# Patient Record
Sex: Female | Born: 1950 | Race: White | Hispanic: No | Marital: Married | Smoking: Never smoker
Health system: Southern US, Community
[De-identification: ages and names within clinical notes are randomized; demographics above are authoritative.]

## PROBLEM LIST (undated history)

## (undated) DIAGNOSIS — I10 Essential (primary) hypertension: Secondary | ICD-10-CM

## (undated) DIAGNOSIS — E119 Type 2 diabetes mellitus without complications: Secondary | ICD-10-CM

## (undated) HISTORY — PX: CHOLECYSTECTOMY: SHX55

## (undated) HISTORY — PX: ABDOMINAL HYSTERECTOMY: SHX81

---

## 2018-09-01 ENCOUNTER — Observation Stay (HOSPITAL_BASED_OUTPATIENT_CLINIC_OR_DEPARTMENT_OTHER)
Admission: EM | Admit: 2018-09-01 | Discharge: 2018-09-03 | Disposition: A | Discharge status: Home / Self Care | Payer: Medicare Other | Attending: Internal Medicine | Admitting: Internal Medicine

## 2018-09-01 ENCOUNTER — Emergency Department (HOSPITAL_BASED_OUTPATIENT_CLINIC_OR_DEPARTMENT_OTHER): Payer: Medicare Other

## 2018-09-01 ENCOUNTER — Encounter (HOSPITAL_BASED_OUTPATIENT_CLINIC_OR_DEPARTMENT_OTHER): Payer: Self-pay

## 2018-09-01 ENCOUNTER — Other Ambulatory Visit: Payer: Self-pay

## 2018-09-01 DIAGNOSIS — G459 Transient cerebral ischemic attack, unspecified: Principal | ICD-10-CM | POA: Insufficient documentation

## 2018-09-01 DIAGNOSIS — R200 Anesthesia of skin: Secondary | ICD-10-CM | POA: Diagnosis present

## 2018-09-01 DIAGNOSIS — I10 Essential (primary) hypertension: Secondary | ICD-10-CM | POA: Diagnosis not present

## 2018-09-01 DIAGNOSIS — E119 Type 2 diabetes mellitus without complications: Secondary | ICD-10-CM

## 2018-09-01 HISTORY — DX: Essential (primary) hypertension: I10

## 2018-09-01 HISTORY — DX: Type 2 diabetes mellitus without complications: E11.9

## 2018-09-01 LAB — COMPREHENSIVE METABOLIC PANEL
ALBUMIN: 3.9 g/dL (ref 3.5–5.0)
ALK PHOS: 59 U/L (ref 38–126)
ALT: 31 U/L (ref 0–44)
AST: 26 U/L (ref 15–41)
Anion gap: 10 (ref 5–15)
BUN: 12 mg/dL (ref 8–23)
CALCIUM: 9.3 mg/dL (ref 8.9–10.3)
CHLORIDE: 99 mmol/L (ref 98–111)
CO2: 27 mmol/L (ref 22–32)
Creatinine, Ser: 0.84 mg/dL (ref 0.44–1.00)
GFR calc non Af Amer: >60 mL/min (ref >60)
GLUCOSE: 241 mg/dL — AB (ref 70–99)
Potassium: 2.7 mmol/L — CL (ref 3.5–5.1)
SODIUM: 136 mmol/L (ref 135–145)
Total Bilirubin: 0.6 mg/dL (ref 0.3–1.2)
Total Protein: 7.3 g/dL (ref 6.5–8.1)

## 2018-09-01 LAB — DIFFERENTIAL
ABS IMMATURE GRANULOCYTES: 0.01 10*3/uL (ref 0.00–0.07)
BASOS ABS: 0 10*3/uL (ref 0.0–0.1)
BASOS PCT: 0 %
Eosinophils Absolute: 0.1 10*3/uL (ref 0.0–0.5)
Eosinophils Relative: 2 %
IMMATURE GRANULOCYTES: 0 %
LYMPHS PCT: 37 %
Lymphs Abs: 1.7 10*3/uL (ref 0.7–4.0)
MONO ABS: 0.3 10*3/uL (ref 0.1–1.0)
Monocytes Relative: 7 %
NEUTROS ABS: 2.5 10*3/uL (ref 1.7–7.7)
NEUTROS PCT: 54 %

## 2018-09-01 LAB — PROTIME-INR
INR: 0.93
Prothrombin Time: 12.4 seconds (ref 11.4–15.2)

## 2018-09-01 LAB — CBC
HCT: 40.1 % (ref 36.0–46.0)
HEMOGLOBIN: 13.1 g/dL (ref 12.0–15.0)
MCH: 29.3 pg (ref 26.0–34.0)
MCHC: 32.7 g/dL (ref 30.0–36.0)
MCV: 89.7 fL (ref 80.0–100.0)
NRBC: 0 % (ref 0.0–0.2)
Platelets: 211 10*3/uL (ref 150–400)
RBC: 4.47 MIL/uL (ref 3.87–5.11)
RDW: 12.7 % (ref 11.5–15.5)
WBC: 4.6 10*3/uL (ref 4.0–10.5)

## 2018-09-01 LAB — APTT: APTT: 26 s (ref 24–36)

## 2018-09-01 LAB — MAGNESIUM: MAGNESIUM: 1.7 mg/dL (ref 1.7–2.4)

## 2018-09-01 LAB — TROPONIN I: Troponin I: <0.03 ng/mL (ref <0.03)

## 2018-09-01 LAB — CBG MONITORING, ED: GLUCOSE-CAPILLARY: 226 mg/dL — AB (ref 70–99)

## 2018-09-01 MED ORDER — POTASSIUM CHLORIDE 10 MEQ/100ML IV SOLN
10.0000 meq | INTRAVENOUS | Status: AC
  Administered 2018-09-01: 10 meq via INTRAVENOUS
  Filled 2018-09-01: qty 100

## 2018-09-01 MED ORDER — ASPIRIN 325 MG PO TABS
650.0000 mg | ORAL_TABLET | Freq: Once | ORAL | Status: AC
  Administered 2018-09-01: 650 mg via ORAL
  Filled 2018-09-01: qty 2

## 2018-09-01 MED ORDER — MAGNESIUM SULFATE 2 GM/50ML IV SOLN
2.0000 g | Freq: Once | INTRAVENOUS | Status: AC
  Administered 2018-09-01: 2 g via INTRAVENOUS
  Filled 2018-09-01: qty 50

## 2018-09-01 MED ORDER — POTASSIUM CHLORIDE CRYS ER 20 MEQ PO TBCR
40.0000 meq | EXTENDED_RELEASE_TABLET | Freq: Once | ORAL | Status: AC
  Administered 2018-09-01: 40 meq via ORAL
  Filled 2018-09-01: qty 2

## 2018-09-01 NOTE — ED Notes (Signed)
Patient transported to CT 

## 2018-09-01 NOTE — ED Provider Notes (Signed)
[facility] EMERGENCY DEPARTMENT Provider Note   CSN: [id number] Arrival date & time: 09/01/18  1942     History   Chief Complaint Chief Complaint  Patient presents with  . Numbness    HPI Samantha Wise is a 67 y.o. female.  67 year old female with past medical history including hypertension, type 2 diabetes mellitus who presents with numbness.  At 7 PM this evening, she was in her kitchen when she had a sudden onset of numbness involving her left face and left arm, associated with left facial droop that husband also noticed as well as not being able to get words out.  All of these symptoms have since resolved.  She denies any extremity weakness, headache, or vision changes.  No fevers or recent illness.  She denies any personal history of TIA or stroke but she does have a family history of stroke in both parents.  The history is provided by the patient and the spouse.    Past Medical History:  Diagnosis Date  . Diabetes mellitus without complication (HCC)   . Hypertension     Patient Active Problem List   Diagnosis Date Noted  . TIA (transient ischemic attack) 09/01/2018    Past Surgical History:  Procedure Laterality Date  . ABDOMINAL HYSTERECTOMY    . CHOLECYSTECTOMY       OB History   None      Home Medications    Prior to Admission medications   Not on File    Family History No family history on file.  Social History Social History   Tobacco Use  . Smoking status: Never Smoker  . Smokeless tobacco: Never Used  Substance Use Topics  . Alcohol use: Yes    Comment: occ  . Drug use: Never     Allergies   Other and Shellfish allergy   Review of Systems Review of Systems All other systems reviewed and are negative except that which was mentioned in HPI   Physical Exam Updated Vital Signs BP (!) 169/92   Pulse 78   Temp 98.8 F (37.1 C) (Oral)   Resp 13   Ht 5\' 3"  (1.6 m)   Wt 68.9 kg   SpO2 98%   BMI 26.93  kg/m   Physical Exam  Constitutional: She is oriented to person, place, and time. She appears well-developed and well-nourished. No distress.  Awake, alert  HENT:  Head: Normocephalic and atraumatic.  Eyes: Pupils are equal, round, and reactive to light. Conjunctivae and EOM are normal.  Neck: Neck supple.  Cardiovascular: Normal rate, regular rhythm and normal heart sounds.  No murmur heard. Pulmonary/Chest: Effort normal and breath sounds normal. No respiratory distress.  Abdominal: Soft. Bowel sounds are normal. She exhibits no distension. There is no tenderness.  Musculoskeletal: She exhibits no edema.  Neurological: She is alert and oriented to person, place, and time. She has normal reflexes. She displays normal reflexes. No sensory deficit. She exhibits normal muscle tone. Coordination normal.  Very subtle weakness L corner of mouth compared to right, remainder of CN intact; Fluent speech, normal finger-to-nose testing, negative pronator drift, no clonus 5/5 strength and normal sensation x all 4 extremities  Skin: Skin is warm and dry.  Psychiatric: She has a normal mood and affect. Judgment and thought content normal.  Nursing note and vitals reviewed.    ED Treatments / Results  Labs (all labs ordered are listed, but only abnormal results are displayed) Labs Reviewed  COMPREHENSIVE METABOLIC PANEL - Abnormal;  Notable for the following components:      Result Value   Potassium 2.7 (*)    Glucose, Bld 241 (*)    All other components within normal limits  CBG MONITORING, ED - Abnormal; Notable for the following components:   Glucose-Capillary 226 (*)    All other components within normal limits  PROTIME-INR  APTT  CBC  DIFFERENTIAL  TROPONIN I  MAGNESIUM  CBG MONITORING, ED    EKG None  Radiology Ct Head Code Stroke Wo Contrast  Result Date: 09/01/2018 CLINICAL DATA:  Code stroke. Acute onset left arm and facial numbness with speech disturbance. EXAM: CT HEAD  WITHOUT CONTRAST TECHNIQUE: Contiguous axial images were obtained from the base of the skull through the vertex without intravenous contrast. COMPARISON:  None. FINDINGS: Brain: There is no evidence of acute infarct, intracranial hemorrhage, mass, midline shift, or extra-axial fluid collection. The ventricles and sulci are within normal limits for age. Minimal cerebral white matter hypoattenuation is nonspecific but likely reflects chronic small vessel ischemia. There is a small chronic infarct in the posterior right cerebellum. Vascular: Calcified atherosclerosis at the skull base. No hyperdense vessel. Skull: No fracture or focal osseous lesion. Sinuses/Orbits: Chronic right maxillary sinusitis with moderate mucosal thickening. Clear mastoid air cells. Unremarkable orbits. Other: None. ASPECTS [facility] Stroke Program Early CT Score) - Ganglionic level infarction (caudate, lentiform nuclei, internal capsule, insula, M1-M3 cortex): 7 - Supraganglionic infarction (M4-M6 cortex): 3 Total score (0-10 with 10 being normal): 10 IMPRESSION: 1. No evidence of acute intracranial abnormality. 2. ASPECTS is 10. 3. Small chronic right cerebellar infarct. These results were called by telephone at the time of interpretation on 09/01/2018 at 8:30 pm to Dr. Frederick Peers , who verbally acknowledged these results. Electronically Signed   By: Sebastian Ache M.D.   On: 09/01/2018 20:32    Procedures Procedures (including critical care time)  Medications Ordered in ED Medications  magnesium sulfate IVPB 2 g 50 mL (has no administration in time range)  potassium chloride 10 mEq in 100 mL IVPB (has no administration in time range)  potassium chloride SA (K-DUR,KLOR-CON) CR tablet 40 mEq (has no administration in time range)  aspirin tablet 650 mg (650 mg Oral Given 09/01/18 2041)     Initial Impression / Assessment and Plan / ED Course  I have reviewed the triage vital signs and the nursing notes.  Pertinent labs &  imaging results that were available during my care of the patient were reviewed by me and considered in my medical decision making (see chart for details).    Pt had fluent speech on exam, very subtle left corner of mouth weakness compared to right. No other neuro deficits. Discussed w/ Dr. Otelia Limes, who agreed that since most of deficits resolved and very subtle mouth droop is only remaining symptom, the risks of tPA would outweigh the benefits. Therefore did not call code stroke. Discussed this with the patient who is in agreement with plan to forego tPA at this point unless symptoms suddenly change.   Head CT negative for acute process, evidence of old right cerebellar infarct.  Lab work notable for potassium of 2.7, glucose 241.  Gave oral and IV potassium repletion as well as magnesium repletion.  Per Dr. Shelbie Hutching request, ordered 650 mg aspirin.  Discussed admission at [facility] with [facility], Dr. Sherrlyn Hock, who has accepted the patient in transfer.  Patient will be admitted to [facility] for further stroke work-up. Final Clinical Impressions(s) / ED Diagnoses  Final diagnoses:  TIA (transient ischemic attack)    ED Discharge Orders    None       Everlina Gotts, Ambrose Finland, MD 09/01/18 2132

## 2018-09-01 NOTE — ED Notes (Signed)
Attempted IV stick without success 

## 2018-09-01 NOTE — ED Notes (Signed)
Called Neurology (Dr. Otelia Limes) for consult.

## 2018-09-01 NOTE — ED Notes (Signed)
CRITICAL VALUE ALERT   Critical Value:  Potassium 2.7 Date & Time Notied:09/01/2018 @ 2119  Provider Notified:  Dr. Clarene Duke

## 2018-09-01 NOTE — ED Triage Notes (Signed)
Pt reports at 7p she was standing in her kitchen when she had onset of numbness to left arm and left side of face and "felt like I couldn't get my words out and the left side of my face was drawing"-pt to triage in w/c-NAD

## 2018-09-02 ENCOUNTER — Observation Stay (HOSPITAL_COMMUNITY): Payer: Medicare Other

## 2018-09-02 ENCOUNTER — Encounter (HOSPITAL_COMMUNITY): Payer: Self-pay | Admitting: Internal Medicine

## 2018-09-02 DIAGNOSIS — I10 Essential (primary) hypertension: Secondary | ICD-10-CM

## 2018-09-02 DIAGNOSIS — G459 Transient cerebral ischemic attack, unspecified: Secondary | ICD-10-CM

## 2018-09-02 DIAGNOSIS — R200 Anesthesia of skin: Secondary | ICD-10-CM | POA: Diagnosis present

## 2018-09-02 DIAGNOSIS — E119 Type 2 diabetes mellitus without complications: Secondary | ICD-10-CM

## 2018-09-02 LAB — GLUCOSE, CAPILLARY: Glucose-Capillary: 108 mg/dL — ABNORMAL HIGH (ref 70–99)

## 2018-09-02 MED ORDER — POTASSIUM CHLORIDE CRYS ER 20 MEQ PO TBCR
20.0000 meq | EXTENDED_RELEASE_TABLET | Freq: Every day | ORAL | Status: DC
  Administered 2018-09-02 – 2018-09-03 (×2): 20 meq via ORAL
  Filled 2018-09-02 (×2): qty 1

## 2018-09-02 MED ORDER — SENNOSIDES-DOCUSATE SODIUM 8.6-50 MG PO TABS: 1.0000 | ORAL_TABLET | Freq: Every evening | ORAL | Status: DC | PRN

## 2018-09-02 MED ORDER — ACETAMINOPHEN 650 MG RE SUPP: 650.0000 mg | RECTAL | Status: DC | PRN

## 2018-09-02 MED ORDER — SIMVASTATIN 40 MG PO TABS
40.0000 mg | ORAL_TABLET | Freq: Every day | ORAL | Status: DC
  Administered 2018-09-02 – 2018-09-03 (×2): 40 mg via ORAL
  Filled 2018-09-02 (×2): qty 1

## 2018-09-02 MED ORDER — LORATADINE 10 MG PO TABS
10.0000 mg | ORAL_TABLET | Freq: Every day | ORAL | Status: DC
  Administered 2018-09-02 – 2018-09-03 (×2): 10 mg via ORAL
  Filled 2018-09-02 (×2): qty 1

## 2018-09-02 MED ORDER — HYDROCHLOROTHIAZIDE 25 MG PO TABS
25.0000 mg | ORAL_TABLET | Freq: Every day | ORAL | Status: DC
  Administered 2018-09-02 – 2018-09-03 (×2): 25 mg via ORAL
  Filled 2018-09-02 (×2): qty 1

## 2018-09-02 MED ORDER — ASPIRIN 300 MG RE SUPP: 300.0000 mg | Freq: Every day | RECTAL | Status: DC

## 2018-09-02 MED ORDER — ASPIRIN 325 MG PO TABS
325.0000 mg | ORAL_TABLET | Freq: Every day | ORAL | Status: DC
  Administered 2018-09-02 – 2018-09-03 (×2): 325 mg via ORAL
  Filled 2018-09-02 (×2): qty 1

## 2018-09-02 MED ORDER — VALSARTAN-HYDROCHLOROTHIAZIDE 160-25 MG PO TABS: 1.0000 | ORAL_TABLET | Freq: Every day | ORAL | Status: DC

## 2018-09-02 MED ORDER — IRBESARTAN 150 MG PO TABS
150.0000 mg | ORAL_TABLET | Freq: Every day | ORAL | Status: DC
  Filled 2018-09-02: qty 1

## 2018-09-02 MED ORDER — METFORMIN HCL 500 MG PO TABS
1000.0000 mg | ORAL_TABLET | Freq: Two times a day (BID) | ORAL | Status: DC
  Administered 2018-09-02 – 2018-09-03 (×2): 1000 mg via ORAL
  Filled 2018-09-02 (×2): qty 2

## 2018-09-02 MED ORDER — ACETAMINOPHEN 325 MG PO TABS: 650.0000 mg | ORAL_TABLET | ORAL | Status: DC | PRN

## 2018-09-02 MED ORDER — ACETAMINOPHEN 160 MG/5ML PO SOLN: 650.0000 mg | ORAL | Status: DC | PRN

## 2018-09-02 MED ORDER — GLIPIZIDE 5 MG PO TABS
5.0000 mg | ORAL_TABLET | Freq: Every day | ORAL | Status: DC
  Administered 2018-09-02 – 2018-09-03 (×2): 5 mg via ORAL
  Filled 2018-09-02 (×2): qty 1

## 2018-09-02 MED ORDER — ENOXAPARIN SODIUM 40 MG/0.4ML ~~LOC~~ SOLN
40.0000 mg | SUBCUTANEOUS | Status: DC
  Administered 2018-09-02: 40 mg via SUBCUTANEOUS
  Filled 2018-09-02: qty 0.4

## 2018-09-02 MED ORDER — NON FORMULARY
Freq: Once | Status: DC
  Administered 2018-09-02: 160 mg via ORAL

## 2018-09-02 MED ORDER — STROKE: EARLY STAGES OF RECOVERY BOOK
Freq: Once | Status: AC
  Administered 2018-09-02: 22:00:00
  Filled 2018-09-02: qty 1

## 2018-09-02 MED ORDER — LINAGLIPTIN 5 MG PO TABS
5.0000 mg | ORAL_TABLET | Freq: Every day | ORAL | Status: DC
  Filled 2018-09-02 (×2): qty 1

## 2018-09-02 MED ORDER — VALSARTAN 160 MG PO TABS
160.0000 mg | ORAL_TABLET | Freq: Once | ORAL | Status: DC
  Filled 2018-09-02: qty 1

## 2018-09-02 NOTE — Consult Note (Addendum)
Referring Physician: Dr. Alcario Drought    Chief Complaint:  TIA  HPI: Samantha Wise is an 67 y.o. female presenting to [facility] in transfer from [facility] for evaluation of a TIA. She has a PMHx of DM and HTN. Her symptoms began in the evening yesterday at 7 PM with sudden onset of left face and arm numbness with left facial droop and speech difficulty. By the time of her ED evaluation, her symptoms had resolved. CT head in the ED was unremarkable. She was started on ASA. 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   Past Medical History:  Diagnosis Date  . Diabetes mellitus without complication ([place])   . Hypertension     Past Surgical History:  Procedure Laterality Date  . ABDOMINAL HYSTERECTOMY    . CHOLECYSTECTOMY      Family History  Problem Relation Age of Onset  . Stroke Mother 27   Social History:  reports that she has never smoked. She has never used smokeless tobacco. She reports that she drinks alcohol. She reports that she does not use drugs.  Allergies:  Allergies  Allergen Reactions  . Other     "an abx"  . Shellfish Allergy     Medications:  Prior to Admission:  Medications Prior to Admission  Medication Sig Dispense Refill Last Dose  . cetirizine (ZYRTEC) 10 MG tablet Take 10 mg by mouth daily.     Marland Kitchen estradiol (CLIMARA) 0.06 MG/24HR Place 1 patch onto the skin once a week.     Marland Kitchen glipiZIDE (GLUCOTROL) 5 MG tablet Take 5 mg by mouth daily before breakfast.     . metFORMIN (GLUCOPHAGE) 1000 MG tablet Take 1,000 mg by mouth 2 (two) times daily with a meal.     . potassium chloride SA (K-DUR,KLOR-CON) 20 MEQ tablet Take 20 mEq by mouth daily.     . simvastatin (ZOCOR) 40 MG tablet Take 40 mg by mouth daily.     . sitaGLIPtin (JANUVIA) 100 MG tablet Take 100 mg by mouth daily.     . valsartan-hydrochlorothiazide (DIOVAN-HCT) 160-25 MG tablet Take 1 tablet by mouth daily.      Scheduled: .  stroke: mapping our early stages  of recovery book   Does not apply Once  . aspirin  300 mg Rectal Daily   Or  . aspirin  325 mg Oral Daily  . enoxaparin (LOVENOX) injection  40 mg Subcutaneous Q24H  . [START ON 09/03/2018] glipiZIDE  5 mg Oral QAC breakfast  . hydrochlorothiazide  25 mg Oral Daily  . linagliptin  5 mg Oral Daily  . loratadine  10 mg Oral Daily  . metFORMIN  1,000 mg Oral BID WC  . potassium chloride SA  20 mEq Oral Daily  . simvastatin  40 mg Oral Daily  . valsartan  160 mg Oral Once    ROS: Does not endorse headache, current visual symptoms or somatic complaints. All neurological symptoms currently resolved.   Physical Examination: Blood pressure (!) 166/90, pulse 77, temperature 98.3 F (36.8 C), temperature source Oral, resp. rate 18, height 5' 3.5" (1.613 m), weight 65.7 kg, SpO2 97 %.  HEENT: [place]/AT Lungs: Respirations unlabored.  Ext: No edema.   Neurologic Examination: Mental Status:  Alert, fully oriented, thought content appropriate.  Speech fluent with intact comprehension for basic motor commands; however, she incorrectly performed one part of a 4-step directional command. Repetition also impaired. Naming intact. Cranial Nerves: II:  Visual  fields intact with no extinction to DSS. PERRL.   III,IV, VI: No ptosis not present. EOMI without nystagmus.  V,VII: No facial droop, facial temp sensation subjectively normal bilaterally VIII: hearing intact to voice IX,X: No hypophonia or hoarseness XI: Symmetric XII: midline tongue extension  Motor: Right : Upper extremity   5/5    Left:     Upper extremity   5/5  Lower extremity   5/5     Lower extremity   5/5 Normal tone throughout; no atrophy noted Sensory: Decreased temp sensation left forearm, sparing the hand. Sensory exam otherwise normal. No extinction to DSS.   Deep Tendon Reflexes:  BUE: 2+ and symmetric Right patella and right achilles: trace Left patella and left achilles: 2+ Plantars: Right: downgoing   Left:  downgoing Cerebellar: No ataxia with FNF bilaterally Gait: Deferred  Results for orders placed or performed during the hospital encounter of 09/01/18 (from the past 48 hour(s))  CBG monitoring, ED     Status: Abnormal   Collection Time: 09/01/18  7:51 PM  Result Value Ref Range   Glucose-Capillary 226 (H) 70 - 99 mg/dL  Protime-INR     Status: None   Collection Time: 09/01/18  8:26 PM  Result Value Ref Range   Prothrombin Time 12.4 11.4 - 15.2 seconds   INR 0.93     Comment: Performed at [facility], [address] [place], [place] [address]  APTT     Status: None   Collection Time: 09/01/18  8:26 PM  Result Value Ref Range   aPTT 26 24 - 36 seconds    Comment: Performed at [facility], [address] [place], [place] [address]  CBC     Status: None   Collection Time: 09/01/18  8:26 PM  Result Value Ref Range   WBC 4.6 4.0 - 10.5 K/uL   RBC 4.47 3.87 - 5.11 MIL/uL   Hemoglobin 13.1 12.0 - 15.0 g/dL   HCT 40.1 36.0 - 46.0 %   MCV 89.7 80.0 - 100.0 fL   MCH 29.3 26.0 - 34.0 pg   MCHC 32.7 30.0 - 36.0 g/dL   RDW 12.7 11.5 - 15.5 %   Platelets 211 150 - 400 K/uL   nRBC 0.0 0.0 - 0.2 %    Comment: Performed at [facility], [address] [place], [place] [address]  Differential     Status: None   Collection Time: 09/01/18  8:26 PM  Result Value Ref Range   Neutrophils Relative % 54 %   Neutro Abs 2.5 1.7 - 7.7 K/uL   Lymphocytes Relative 37 %   Lymphs Abs 1.7 0.7 - 4.0 K/uL   Monocytes Relative 7 %   Monocytes Absolute 0.3 0.1 - 1.0 K/uL   Eosinophils Relative 2 %   Eosinophils Absolute 0.1 0.0 - 0.5 K/uL   Basophils Relative 0 %   Basophils Absolute 0.0 0.0 - 0.1 K/uL   Immature Granulocytes 0 %   Abs Immature Granulocytes 0.01 0.00 - 0.07 K/uL    Comment: Performed at [facility], [address] [place], [place] [address]  Comprehensive metabolic panel     Status: Abnormal   Collection Time: 09/01/18  8:26 PM  Result  Value Ref Range   Sodium 136 135 - 145 mmol/L   Potassium 2.7 (LL) 3.5 - 5.1 mmol/L    Comment: CRITICAL RESULT CALLED TO, READ BACK BY AND VERIFIED WITH: Garlon Hatchet  [facility] RN AT 2118 ON 09/01/2018 I.SUGUT    Chloride 99 98 - 111 mmol/L   CO2 27 22 - 32 mmol/L   Glucose, Bld 241 (H) 70 - 99 mg/dL   BUN 12 8 - 23 mg/dL   Creatinine, Ser 0.84 0.44 - 1.00 mg/dL   Calcium 9.3 8.9 - 10.3 mg/dL   Total Protein 7.3 6.5 - 8.1 g/dL   Albumin 3.9 3.5 - 5.0 g/dL   AST 26 15 - 41 U/L   ALT 31 0 - 44 U/L   Alkaline Phosphatase 59 38 - 126 U/L   Total Bilirubin 0.6 0.3 - 1.2 mg/dL   GFR calc non Af Amer >60 >60 mL/min   GFR calc Af Amer >60 >60 mL/min    Comment: (NOTE) The eGFR has been calculated using the CKD EPI equation. This calculation has not been validated in all clinical situations. eGFR's persistently <60 mL/min signify possible Chronic Kidney Disease.    Anion gap 10 5 - 15    Comment: Performed at [facility], [address] [place], [place] [address]  Troponin I     Status: None   Collection Time: 09/01/18  8:26 PM  Result Value Ref Range   Troponin I <0.03 <0.03 ng/mL    Comment: Performed at [facility], [address] [place], [place] [address]  Magnesium     Status: None   Collection Time: 09/01/18  8:26 PM  Result Value Ref Range   Magnesium 1.7 1.7 - 2.4 mg/dL    Comment: Performed at [facility], [address] [place], [place] [address]  Glucose, capillary     Status: Abnormal   Collection Time: 09/02/18  9:04 PM  Result Value Ref Range   Glucose-Capillary 108 (H) 70 - 99 mg/dL   Comment 1 Notify RN    Comment 2 Document in Chart    Ct Head Code Stroke Wo Contrast  Result Date: 09/01/2018 CLINICAL DATA:  Code stroke. Acute onset left arm and facial numbness with speech disturbance. EXAM: CT HEAD WITHOUT CONTRAST TECHNIQUE: Contiguous axial images were obtained from the base of the skull through the vertex without intravenous  contrast. COMPARISON:  None. FINDINGS: Brain: There is no evidence of acute infarct, intracranial hemorrhage, mass, midline shift, or extra-axial fluid collection. The ventricles and sulci are within normal limits for age. Minimal cerebral white matter hypoattenuation is nonspecific but likely reflects chronic small vessel ischemia. There is a small chronic infarct in the posterior right cerebellum. Vascular: Calcified atherosclerosis at the skull base. No hyperdense vessel. Skull: No fracture or focal osseous lesion. Sinuses/Orbits: Chronic right maxillary sinusitis with moderate mucosal thickening. Clear mastoid air cells. Unremarkable orbits. Other: None. ASPECTS [facility] Stroke Program Early CT Score) - Ganglionic level infarction (caudate, lentiform nuclei, internal capsule, insula, M1-M3 cortex): 7 - Supraganglionic infarction (M4-M6 cortex): 3 Total score (0-10 with 10 being normal): 10 IMPRESSION: 1. No evidence of acute intracranial abnormality. 2. ASPECTS is 10. 3. Small chronic right cerebellar infarct. These results were called by telephone at the time of interpretation on 09/01/2018 at 8:30 pm to Dr. Theotis Burrow , who verbally acknowledged these results. Electronically Signed   By: Logan Bores M.D.   On: 09/01/2018 20:32    Assessment: 67 y.o. female presenting to OSH with TIA symptoms of left face and arm numbness with left facial droop.  1. CT head at OSH revealed no acute abnormality. A small chronic right  cerebellar infarct was noted. 2. Exam reveals left forearm sensory deficit and asymmetric lower extremity reflexes. Some findings consistent with dysphasia also noted.  3. The patient was not on an antiplatelet medication or an anticoagulant as an outpatient.  4. Stroke Risk Factors - DM, HTN and estradiol treatment.  5. MRI brain reveals no acute intracranial infarct or other abnormality identified. Small remote right cerebellar infarct again noted. There is underlying mild chronic  small vessel ischemic disease for age. 6. MRA head: Question 2 mm focal outpouching arising from the left anterior communicating artery complex, suspicious for a possible small aneurysm. Mild distal small vessel atheromatous irregularity throughout the intracranial circulation. No large vessel occlusion. No hemodynamically significant or correctable stenosis.  Plan: 1. HgbA1c, fasting lipid panel 2. BP management. Out of permissive HTN time window. 3. PT consult, OT consult, Speech consult 4. Echocardiogram 5. Carotid dopplers 6. Prophylactic therapy- Continue with ASA. Continue simvastatin.  7. Risk factor modification 8. Telemetry monitoring 9. Frequent neuro checks 10. Estradiol has been held. The patient most likely should remain off this medication due to increased stroke risk as a side effect.   _0  signed: Dr. Kerney Elbe  09/02/2018, 10:04 PM

## 2018-09-02 NOTE — ED Notes (Signed)
Pt and 2 family members given crackers, cheese, peanut butter and cheerios with milk per request. Pt denies any further c/o or needs.

## 2018-09-02 NOTE — ED Notes (Signed)
Pt ambulated to RR with minimal assistance 

## 2018-09-02 NOTE — H&P (Signed)
History and Physical    Samantha Wise ZOX:[id number] DOB: Oct 31, 1951 DOA: 09/01/2018  PCP: System, Pcp Not In  Patient coming from: Home  I have personally briefly reviewed patient's old medical records in [facility]  Chief Complaint: TIA  HPI: Samantha Wise is a 67 y.o. female with medical history significant of DM2, HTN.  Patient presented to ED at [facility] on 10/29 evening with c/o sudden onset of numbness of L face and L arm, associated L facial droop and difficulty with speech.  Symptoms onset 7pm on 10/29, resolved by time she was evaluated in ED.   ED Course: CT head neg, patient put on ASA, sent to [facility] for TIA work up.  Unfortunatly due to no available beds, took 24h before patient arrived.  In the mean time they have resumed all of patients home meds and diet.  Patient remains asymptomatic from stroke standpoint thankfully.   Review of Systems: As per HPI otherwise 10 point review of systems negative.   Past Medical History:  Diagnosis Date  . Diabetes mellitus without complication (HCC)   . Hypertension     Past Surgical History:  Procedure Laterality Date  . ABDOMINAL HYSTERECTOMY    . CHOLECYSTECTOMY       reports that she has never smoked. She has never used smokeless tobacco. She reports that she drinks alcohol. She reports that she does not use drugs.  Allergies  Allergen Reactions  . Other     "an abx"  . Shellfish Allergy     Family History  Problem Relation Age of Onset  . Stroke Mother 44     Prior to Admission medications   Medication Sig Start Date End Date Taking? Authorizing Provider  cetirizine (ZYRTEC) 10 MG tablet Take 10 mg by mouth daily.   Yes [provider]  estradiol (CLIMARA) 0.06 MG/24HR Place 1 patch onto the skin once a week.   Yes [provider]  glipiZIDE (GLUCOTROL) 5 MG tablet Take 5 mg by mouth daily before breakfast.   Yes [provider]  metFORMIN (GLUCOPHAGE) 1000 MG  tablet Take 1,000 mg by mouth 2 (two) times daily with a meal.   Yes [provider]  potassium chloride SA (K-DUR,KLOR-CON) 20 MEQ tablet Take 20 mEq by mouth daily.   Yes [provider]  simvastatin (ZOCOR) 40 MG tablet Take 40 mg by mouth daily.   Yes [provider]  sitaGLIPtin (JANUVIA) 100 MG tablet Take 100 mg by mouth daily.   Yes [provider]  valsartan-hydrochlorothiazide (DIOVAN-HCT) 160-25 MG tablet Take 1 tablet by mouth daily.   Yes [provider]    Physical Exam: Vitals:   09/02/18 1542 09/02/18 1600 09/02/18 1817 09/02/18 2055  BP: (!) 177/88 (!) 175/88 (!) 159/95 (!) 166/90  Pulse: 64 65 69 77  Resp: 18 18 18 18   Temp:    98.3 F (36.8 C)  TempSrc:    Oral  SpO2: 98% 98% 97% 97%  Weight:    65.7 kg  Height:    5' 3.5" (1.613 m)    Constitutional: NAD, calm, comfortable Eyes: PERRL, lids and conjunctivae normal ENMT: Mucous membranes are moist. Posterior pharynx clear of any exudate or lesions.Normal dentition.  Neck: normal, supple, no masses, no thyromegaly Respiratory: clear to auscultation bilaterally, no wheezing, no crackles. Normal respiratory effort. No accessory muscle use.  Cardiovascular: Regular rate and rhythm, no murmurs / rubs / gallops. No extremity edema. 2+ pedal pulses. No carotid bruits.  Abdomen: no tenderness, no masses palpated. No hepatosplenomegaly. Bowel sounds positive.  Musculoskeletal: no clubbing / cyanosis. No joint deformity upper and lower extremities. Good ROM, no contractures. Normal muscle tone.  Skin: no rashes, lesions, ulcers. No induration Neurologic: CN 2-12 grossly intact. Sensation intact, DTR normal. Strength 5/5 in all 4.  Psychiatric: Normal judgment and insight. Alert and oriented x 3. Normal mood.    Labs on Admission: I have personally reviewed following labs and imaging studies  CBC: Recent Labs  Lab 09/01/18 2026  WBC 4.6  NEUTROABS 2.5  HGB 13.1  HCT 40.1    MCV 89.7  PLT 211   Basic Metabolic Panel: Recent Labs  Lab 09/01/18 2026  NA 136  K 2.7*  CL 99  CO2 27  GLUCOSE 241*  BUN 12  CREATININE 0.84  CALCIUM 9.3  MG 1.7   GFR: Estimated Creatinine Clearance: 59.9 mL/min (by C-G formula based on SCr of 0.84 mg/dL). Liver Function Tests: Recent Labs  Lab 09/01/18 2026  AST 26  ALT 31  ALKPHOS 59  BILITOT 0.6  PROT 7.3  ALBUMIN 3.9   No results for input(s): LIPASE, AMYLASE in the last 168 hours. No results for input(s): AMMONIA in the last 168 hours. Coagulation Profile: Recent Labs  Lab 09/01/18 2026  INR 0.93   Cardiac Enzymes: Recent Labs  Lab 09/01/18 2026  TROPONINI <0.03   BNP (last 3 results) No results for input(s): PROBNP in the last 8760 hours. HbA1C: No results for input(s): HGBA1C in the last 72 hours. CBG: Recent Labs  Lab 09/01/18 1951 09/02/18 2104  GLUCAP 226* 108*   Lipid Profile: No results for input(s): CHOL, HDL, LDLCALC, TRIG, CHOLHDL, LDLDIRECT in the last 72 hours. Thyroid Function Tests: No results for input(s): TSH, T4TOTAL, FREET4, T3FREE, THYROIDAB in the last 72 hours. Anemia Panel: No results for input(s): VITAMINB12, FOLATE, FERRITIN, TIBC, IRON, RETICCTPCT in the last 72 hours. Urine analysis: No results found for: COLORURINE, APPEARANCEUR, LABSPEC, PHURINE, GLUCOSEU, HGBUR, BILIRUBINUR, KETONESUR, PROTEINUR, UROBILINOGEN, NITRITE, LEUKOCYTESUR  Radiological Exams on Admission: Ct Head Code Stroke Wo Contrast  Result Date: 09/01/2018 CLINICAL DATA:  Code stroke. Acute onset left arm and facial numbness with speech disturbance. EXAM: CT HEAD WITHOUT CONTRAST TECHNIQUE: Contiguous axial images were obtained from the base of the skull through the vertex without intravenous contrast. COMPARISON:  None. FINDINGS: Brain: There is no evidence of acute infarct, intracranial hemorrhage, mass, midline shift, or extra-axial fluid collection. The ventricles and sulci are within normal  limits for age. Minimal cerebral white matter hypoattenuation is nonspecific but likely reflects chronic small vessel ischemia. There is a small chronic infarct in the posterior right cerebellum. Vascular: Calcified atherosclerosis at the skull base. No hyperdense vessel. Skull: No fracture or focal osseous lesion. Sinuses/Orbits: Chronic right maxillary sinusitis with moderate mucosal thickening. Clear mastoid air cells. Unremarkable orbits. Other: None. ASPECTS [facility] Stroke Program Early CT Score) - Ganglionic level infarction (caudate, lentiform nuclei, internal capsule, insula, M1-M3 cortex): 7 - Supraganglionic infarction (M4-M6 cortex): 3 Total score (0-10 with 10 being normal): 10 IMPRESSION: 1. No evidence of acute intracranial abnormality. 2. ASPECTS is 10. 3. Small chronic right cerebellar infarct. These results were called by telephone at the time of interpretation on 09/01/2018 at 8:30 pm to Dr. Frederick Peers , who verbally acknowledged these results. Electronically Signed   By: Sebastian Ache M.D.   On: 09/01/2018 20:32    EKG: Independently reviewed.  Assessment/Plan Principal Problem:   TIA (transient ischemic  attack) Active Problems:   DM2 (diabetes mellitus, type 2) (HCC)   HTN (hypertension)    1. TIA - 1. TIA pathway and work up 2. MRI / MRA 3. US carotids 4. 2d echo 5. Tele monitor 6. A1C and FLP 7. Neuro notified of patients arrival 2. DM2 - 1. Will leave patient on home PO hypoglycemics since these have been resumed 3. HTN - 1. Will leave patient on home Diovan-HCTZ which has already been resumed earlier today. 2. Will hold off on ordering further BP meds unless she gets above 200 systolic.  DVT prophylaxis: Lovenox Code Status: Full Family Communication: No family in room Disposition Plan: Home after admit Consults called: Neuro Admission status: Place in obs   Hillary Bow. DO [facility] Pager [phone] Only works nights!  If 7AM-7PM,  please contact the primary day team physician taking care of patient  [email or web address] Password [email or web address]  09/02/2018, 9:53 PM

## 2018-09-02 NOTE — ED Notes (Signed)
[facility] pharmacy tech, [place], consulted regarding ordering pt home meds. States she will order them from [facility].

## 2018-09-03 ENCOUNTER — Observation Stay (HOSPITAL_BASED_OUTPATIENT_CLINIC_OR_DEPARTMENT_OTHER): Payer: Medicare Other

## 2018-09-03 DIAGNOSIS — G459 Transient cerebral ischemic attack, unspecified: Secondary | ICD-10-CM | POA: Diagnosis not present

## 2018-09-03 DIAGNOSIS — I5030 Unspecified diastolic (congestive) heart failure: Secondary | ICD-10-CM

## 2018-09-03 DIAGNOSIS — E119 Type 2 diabetes mellitus without complications: Secondary | ICD-10-CM | POA: Diagnosis not present

## 2018-09-03 DIAGNOSIS — I10 Essential (primary) hypertension: Secondary | ICD-10-CM | POA: Diagnosis not present

## 2018-09-03 LAB — ECHOCARDIOGRAM COMPLETE
HEIGHTINCHES: 63.5 in
WEIGHTICAEL: 2317.48 [oz_av]

## 2018-09-03 LAB — LIPID PANEL
CHOL/HDL RATIO: 4.2 ratio
CHOLESTEROL: 129 mg/dL (ref 0–200)
HDL: 31 mg/dL — ABNORMAL LOW (ref >40)
LDL Cholesterol: 64 mg/dL (ref 0–99)
Triglycerides: 170 mg/dL — ABNORMAL HIGH (ref <150)
VLDL: 34 mg/dL (ref 0–40)

## 2018-09-03 LAB — GLUCOSE, CAPILLARY
GLUCOSE-CAPILLARY: 120 mg/dL — AB (ref 70–99)
Glucose-Capillary: 169 mg/dL — ABNORMAL HIGH (ref 70–99)

## 2018-09-03 LAB — HIV ANTIBODY (ROUTINE TESTING W REFLEX): HIV SCREEN 4TH GENERATION: NONREACTIVE

## 2018-09-03 LAB — HEMOGLOBIN A1C
Hgb A1c MFr Bld: 8.9 % — ABNORMAL HIGH (ref 4.8–5.6)
MEAN PLASMA GLUCOSE: 208.73 mg/dL

## 2018-09-03 MED ORDER — INFLUENZA VAC SPLIT HIGH-DOSE 0.5 ML IM SUSY: 0.5000 mL | PREFILLED_SYRINGE | INTRAMUSCULAR | Status: DC

## 2018-09-03 MED ORDER — ASPIRIN 81 MG PO TBEC: 81.0000 mg | DELAYED_RELEASE_TABLET | Freq: Every day | ORAL | Status: AC

## 2018-09-03 MED ORDER — ASPIRIN EC 81 MG PO TBEC: 81.0000 mg | DELAYED_RELEASE_TABLET | Freq: Every day | ORAL | Status: DC

## 2018-09-03 MED ORDER — CLOPIDOGREL BISULFATE 75 MG PO TABS
75.0000 mg | ORAL_TABLET | Freq: Every day | ORAL | Status: DC
  Administered 2018-09-03: 75 mg via ORAL
  Filled 2018-09-03: qty 1

## 2018-09-03 MED ORDER — IRBESARTAN 150 MG PO TABS
150.0000 mg | ORAL_TABLET | Freq: Every day | ORAL | Status: DC
  Administered 2018-09-03: 150 mg via ORAL
  Filled 2018-09-03: qty 1

## 2018-09-03 MED ORDER — CLOPIDOGREL BISULFATE 75 MG PO TABS: 75.0000 mg | ORAL_TABLET | Freq: Every day | ORAL | 0 refills | Status: AC

## 2018-09-03 MED ORDER — CLOPIDOGREL BISULFATE 75 MG PO TABS: 75.0000 mg | ORAL_TABLET | Freq: Every day | ORAL | 0 refills | Status: DC

## 2018-09-03 NOTE — Progress Notes (Addendum)
STROKE TEAM PROGRESS NOTE   INTERVAL HISTORY Her husband and friend who is like a brother are at the bedside.  She went to [facility] with symptoms that started Tues and stayed there a day waiting on a bed at [facility]. Symptoms have not recurreed.  Patient admitted to not taking meds routinely PTA as she is moving/selling her house and has been distracted. She is currently closing on her home, receiving phone calls during rounds.    Vitals:   09/03/18 0230 09/03/18 0430 09/03/18 0634 09/03/18 0758  BP: (!) 179/96 (!) 165/82 (!) 166/84 (!) 164/84  Pulse: 74 61 61 81  Resp: 18 18 18 18   Temp:  98.3 F (36.8 C)  97.8 F (36.6 C)  TempSrc:  Oral  Oral  SpO2: 100% 98% 100% 98%  Weight:      Height:        CBC:  Recent Labs  Lab 09/01/18 2026  WBC 4.6  NEUTROABS 2.5  HGB 13.1  HCT 40.1  MCV 89.7  PLT 211    Basic Metabolic Panel:  Recent Labs  Lab 09/01/18 2026  NA 136  K 2.7*  CL 99  CO2 27  GLUCOSE 241*  BUN 12  CREATININE 0.84  CALCIUM 9.3  MG 1.7   Lipid Panel:     Component Value Date/Time   CHOL 129 09/03/2018 0456   TRIG 170 (H) 09/03/2018 0456   HDL 31 (L) 09/03/2018 0456   CHOLHDL 4.2 09/03/2018 0456   VLDL 34 09/03/2018 0456   LDLCALC 64 09/03/2018 0456   HgbA1c:  Lab Results  Component Value Date   HGBA1C 8.9 (H) 09/03/2018   Urine Drug Screen: No results found for: LABOPIA, COCAINSCRNUR, LABBENZ, AMPHETMU, THCU, LABBARB  Alcohol Level No results found for: [facility]  IMAGING Dg Chest 2 View  Result Date: 09/02/2018 CLINICAL DATA:  Initial evaluation for acute TIA. EXAM: CHEST - 2 VIEW COMPARISON:  None. FINDINGS: The cardiac and mediastinal silhouettes are within normal limits. The lungs are normally inflated. Mild subsegmental atelectasis at the left lung base. No airspace consolidation, pleural effusion, or pulmonary edema is identified. There is no pneumothorax. No acute osseous abnormality identified. IMPRESSION: 1. Minimal subsegmental  atelectasis at the left lung base. 2. No other active cardiopulmonary disease. Electronically Signed   By: Rise Mu M.D.   On: 09/02/2018 22:45   Mr Brain Wo Contrast  Result Date: 09/03/2018 CLINICAL DATA:  Initial evaluation for acute left-sided numbness. EXAM: MRI HEAD WITHOUT CONTRAST MRA HEAD WITHOUT CONTRAST TECHNIQUE: Multiplanar, multiecho pulse sequences of the brain and surrounding structures were obtained without intravenous contrast. Angiographic images of the head were obtained using MRA technique without contrast. COMPARISON:  Comparison made with prior CT from 09/01/2018 FINDINGS: MRI HEAD FINDINGS Brain: Generalized age-related cerebral atrophy. Mild chronic small vessel ischemic changes present within the periventricular white matter and pons. Small remote right cerebellar infarct noted. No abnormal foci of restricted diffusion to suggest acute or subacute ischemia. Gray-white matter differentiation maintained. No other areas of remote cortical infarction. No foci of susceptibility artifact to suggest acute or chronic intracranial hemorrhage. No mass lesion, midline shift or mass effect. No hydrocephalus. No extra-axial fluid collection. Pituitary gland within normal limits. Vascular: Major intracranial vascular flow voids maintained. Skull and upper cervical spine: Craniocervical junction within normal limits. Visualized upper cervical spine unremarkable. No focal marrow replacing lesion. Scalp soft tissues unremarkable. Sinuses/Orbits: Globes and orbital soft tissues within normal limits. Several small retention cyst noted  within the right maxillary sinus. Paranasal sinuses are otherwise clear. No significant mastoid effusion. Inner ear structures grossly normal. Other: None. MRA HEAD FINDINGS ANTERIOR CIRCULATION: Distal cervical segments of the internal carotid arteries are widely patent with antegrade flow. Petrous, cavernous, and supraclinoid segments patent without  hemodynamically significant stenosis. ICA termini widely patent. A1 segments widely patent bilaterally. There is question of a tiny 2 mm focal outpouching extending from the left aspect of the anterior communicating artery complex (series 5, image 86), which could reflect a small aneurysm. Anterior cerebral arteries widely patent distally without stenosis. M1 segments widely patent bilaterally. Normal MCA bifurcations. No proximal M2 occlusion. Distal MCA branches well perfused and symmetric. Mild distal small vessel atheromatous irregularity. POSTERIOR CIRCULATION: Visualized vertebral arteries patent to the vertebrobasilar junction without stenosis. Left vertebral artery dominant. Posterior inferior cerebral arteries patent proximally. Basilar widely patent to its distal aspect without stenosis. Superior cerebral arteries patent bilaterally. Right PCA supplied via the basilar. Predominant fetal type origin of the left PCA supplied via a widely patent left posterior communicating artery. PCAs widely patent to their distal aspects. Mild distal small vessel atheromatous irregularity. IMPRESSION: MRI HEAD IMPRESSION: 1. No acute intracranial infarct or other abnormality identified. 2. Small remote right cerebellar infarct. 3. Underlying mild chronic small vessel ischemic disease for age. MRA HEAD IMPRESSION: 1. Question 2 mm focal outpouching arising from the left anterior communicating artery complex, suspicious for a possible small aneurysm. 2. Mild distal small vessel atheromatous irregularity throughout the intracranial circulation. 3. Otherwise unremarkable intracranial MRA. No large vessel occlusion. No hemodynamically significant or correctable stenosis. Electronically Signed   By: Rise Mu M.D.   On: 09/03/2018 01:01   Mr Maxine Glenn Head Wo Contrast  Result Date: 09/03/2018 CLINICAL DATA:  Initial evaluation for acute left-sided numbness. EXAM: MRI HEAD WITHOUT CONTRAST MRA HEAD WITHOUT CONTRAST  TECHNIQUE: Multiplanar, multiecho pulse sequences of the brain and surrounding structures were obtained without intravenous contrast. Angiographic images of the head were obtained using MRA technique without contrast. COMPARISON:  Comparison made with prior CT from 09/01/2018 FINDINGS: MRI HEAD FINDINGS Brain: Generalized age-related cerebral atrophy. Mild chronic small vessel ischemic changes present within the periventricular white matter and pons. Small remote right cerebellar infarct noted. No abnormal foci of restricted diffusion to suggest acute or subacute ischemia. Gray-white matter differentiation maintained. No other areas of remote cortical infarction. No foci of susceptibility artifact to suggest acute or chronic intracranial hemorrhage. No mass lesion, midline shift or mass effect. No hydrocephalus. No extra-axial fluid collection. Pituitary gland within normal limits. Vascular: Major intracranial vascular flow voids maintained. Skull and upper cervical spine: Craniocervical junction within normal limits. Visualized upper cervical spine unremarkable. No focal marrow replacing lesion. Scalp soft tissues unremarkable. Sinuses/Orbits: Globes and orbital soft tissues within normal limits. Several small retention cyst noted within the right maxillary sinus. Paranasal sinuses are otherwise clear. No significant mastoid effusion. Inner ear structures grossly normal. Other: None. MRA HEAD FINDINGS ANTERIOR CIRCULATION: Distal cervical segments of the internal carotid arteries are widely patent with antegrade flow. Petrous, cavernous, and supraclinoid segments patent without hemodynamically significant stenosis. ICA termini widely patent. A1 segments widely patent bilaterally. There is question of a tiny 2 mm focal outpouching extending from the left aspect of the anterior communicating artery complex (series 5, image 86), which could reflect a small aneurysm. Anterior cerebral arteries widely patent distally  without stenosis. M1 segments widely patent bilaterally. Normal MCA bifurcations. No proximal M2 occlusion. Distal MCA branches well perfused and  symmetric. Mild distal small vessel atheromatous irregularity. POSTERIOR CIRCULATION: Visualized vertebral arteries patent to the vertebrobasilar junction without stenosis. Left vertebral artery dominant. Posterior inferior cerebral arteries patent proximally. Basilar widely patent to its distal aspect without stenosis. Superior cerebral arteries patent bilaterally. Right PCA supplied via the basilar. Predominant fetal type origin of the left PCA supplied via a widely patent left posterior communicating artery. PCAs widely patent to their distal aspects. Mild distal small vessel atheromatous irregularity. IMPRESSION: MRI HEAD IMPRESSION: 1. No acute intracranial infarct or other abnormality identified. 2. Small remote right cerebellar infarct. 3. Underlying mild chronic small vessel ischemic disease for age. MRA HEAD IMPRESSION: 1. Question 2 mm focal outpouching arising from the left anterior communicating artery complex, suspicious for a possible small aneurysm. 2. Mild distal small vessel atheromatous irregularity throughout the intracranial circulation. 3. Otherwise unremarkable intracranial MRA. No large vessel occlusion. No hemodynamically significant or correctable stenosis. Electronically Signed   By: Rise Mu M.D.   On: 09/03/2018 01:01   Ct Head Code Stroke Wo Contrast  Result Date: 09/01/2018 CLINICAL DATA:  Code stroke. Acute onset left arm and facial numbness with speech disturbance. EXAM: CT HEAD WITHOUT CONTRAST TECHNIQUE: Contiguous axial images were obtained from the base of the skull through the vertex without intravenous contrast. COMPARISON:  None. FINDINGS: Brain: There is no evidence of acute infarct, intracranial hemorrhage, mass, midline shift, or extra-axial fluid collection. The ventricles and sulci are within normal limits for  age. Minimal cerebral white matter hypoattenuation is nonspecific but likely reflects chronic small vessel ischemia. There is a small chronic infarct in the posterior right cerebellum. Vascular: Calcified atherosclerosis at the skull base. No hyperdense vessel. Skull: No fracture or focal osseous lesion. Sinuses/Orbits: Chronic right maxillary sinusitis with moderate mucosal thickening. Clear mastoid air cells. Unremarkable orbits. Other: None. ASPECTS [facility] Stroke Program Early CT Score) - Ganglionic level infarction (caudate, lentiform nuclei, internal capsule, insula, M1-M3 cortex): 7 - Supraganglionic infarction (M4-M6 cortex): 3 Total score (0-10 with 10 being normal): 10 IMPRESSION: 1. No evidence of acute intracranial abnormality. 2. ASPECTS is 10. 3. Small chronic right cerebellar infarct. These results were called by telephone at the time of interpretation on 09/01/2018 at 8:30 pm to Dr. Frederick Peers , who verbally acknowledged these results. Electronically Signed   By: Sebastian Ache M.D.   On: 09/01/2018 20:32   2D Echocardiogram  - Left ventricle: The cavity size was normal. Systolic function was normal. The estimated ejection fraction was in the range of 55% to 60%. Wall motion was normal; there were no regional wall motion abnormalities. Doppler parameters are consistent with abnormal left ventricular relaxation (grade 1 diastolic dysfunction). - Atrial septum: No defect or patent foramen ovale was identified.  Carotid Doppler   There is 1-39% bilateral ICA stenosis. Vertebral artery flow is antegrade.    PHYSICAL EXAM  pleasant middle-age Caucasian lady currently not in distress. . Afebrile. Head is nontraumatic. Neck is supple without bruit.    Cardiac exam no murmur or gallop. Lungs are clear to auscultation. Distal pulses are well felt.  Neurological Exam ;  Awake  Alert oriented x 3. Normal speech and language.eye movements full without nystagmus.fundi were not visualized. Vision  acuity and fields appear normal. Hearing is normal. Palatal movements are normal. Face symmetric. Tongue midline. Normal strength, tone, reflexes and coordination. Normal sensation. Gait deferred.    ASSESSMENT/PLAN Samantha Wise is a 67 y.o. female with history of DB, HTN presenting with transient L arm and  face numbness, L facial droop and speech difficulty  TIA  Code Stroke CT head old small R cerebellar infarct. No acute stroke. ASPECTS 10.     MRI  No acute infarct. Old R cerebellar infarct. Small vessel disease. Atrophy.   MRA  ?2mm L ACom aneurysm. Small vessel disease.   Carotid Doppler  B ICA 1-39% stenosis, VAs antegrade   2D Echo  EF 55-60%. No source of embolus   LDL 64  HgbA1c 8.9  Lovenox 40 mg sq daily for VTE prophylaxis  No antithrombotic prior to admission, now on aspirin 325 mg daily. Given TIA, recommend aspirin 81 mg and plavix 75 mg daily x 3 weeks, then aspirin alone. Orders adjusted.   Therapy recommendations:  No therapy needs  Disposition:  Return home Follow-up Stroke Clinic at [facility] in 4 weeks. Office will call with appointment date and time. Order placed.  Hypertension  Stable . BP goal normotensive  Hyperlipidemia  Home meds:  zocor 40 , resumed in hospital  LDL 64, goal < 70  Continue statin at discharge  Diabetes type II  HgbA1c 8.9, goal < 7.0  Uncontrolled  Other Stroke Risk Factors  Advanced age  Family hx stroke (mother)  Hospital day # 0  Annie Main, MSN, APRN, ANVP-BC, AGPCNP-BC Advanced Practice Stroke Nurse [facility] See Amion for Schedule & Pager information 09/03/2018 1:47 PM  I have personally examined this patient, reviewed notes, independently viewed imaging studies, participated in medical decision making and plan of care.ROS completed by me personally and pertinent positives fully documented  I have made any additions or clarifications directly to the  above note. Agree with note above.  She presented with TIAs and admits to noncompliance with her medicine. She was counseled to take her medications regularly. Recommend dual antiplatelet therapy for 3 weeks followed by aspirin alone. Greater than 50% and in the strength to visit was spent on counseling and coordination He is having questions  Delia Heady, MD Medical Director [facility] Pager: [phone] 09/03/2018 3:18 PM  To contact Stroke Continuity provider, please refer to [email or web address]. After hours, contact General Neurology

## 2018-09-03 NOTE — Care Management Note (Signed)
Case Management Note  Patient Details  Name: Samantha Wise MRN: [id number] Date of Birth: 02/04/1951  Subjective/Objective:     Pt in with TIA. She is from home with her spouse and son. Pt new to [facility] and doesn't have a PCP there. She still has Dr Early Chars in [place].  Pt denies any DME. IADL. Pt denies any issues with obtaining her medications and no transportation issues.               Action/Plan: CM consulted for outpatient therapy. Pt would like to attend in [facility]. CM placed orders for [facility] outpatient therapy.  Pt has a PCP in mind but CM also provided her other primary care physicians in her area that are taking new patients. Pt to f/u with finding a PCP.  CM following.  Expected Discharge Date:  09/03/18               Expected Discharge Plan:  OP Rehab  In-House Referral:     Discharge planning Services  CM Consult, Other - See comment  Post Acute Care Choice:    Choice offered to:     DME Arranged:    DME Agency:     HH Arranged:    HH Agency:     Status of Service:  In process, will continue to follow  If discussed at Long Length of Stay Meetings, dates discussed:    Additional Comments:  Kermit Balo, RN 09/03/2018, 11:55 AM

## 2018-09-03 NOTE — Progress Notes (Signed)
Carotid duplex prelim: 1-39% ICA stenosis.  Dynastee Brummell Eunice, RDMS, RVT   

## 2018-09-03 NOTE — Progress Notes (Signed)
PROGRESS NOTE    Samantha Wise  ZOX:[id number] DOB: 05-09-1951 DOA: 09/01/2018 PCP: System, Pcp Not In  Brief Narrative:Samantha Wise is a 67 y.o. female with medical history significant of DM2, HTN.  Patient presented to ED at [facility] on 10/29 evening with c/o sudden onset of numbness of L face and L arm, associated L facial droop and difficulty with speech.  Symptoms onset 7pm on 10/29, resolved by time she was evaluated in ED.  Assessment & Plan:   Facial and left arm numbness, slurred speech -Transient, resolved, suspect TIA -MRI brain without any acute findings, evidence of prior lacunar infarct noted -MRA without any large vessel occlusions, ?2 mm focal outpouching arising from the left anterior communicating artery complex, suspicious for a possible small aneurysm, will need FU -Now on aspirin daily -Carotid duplex without significant stenosis -Follow-up 2D echocardiogram -LDL was 64, hemoglobin A1c was 8.9 -Neurology following -Await PT/OT evaluations    DM2 (diabetes mellitus, type 2) (HCC) -On metformin, glipizide and Januvia -Has had dietary indiscretion recently, suspect this accounts for her spike in her A1c from 7 to 8.9 -Advised continued weight loss, diet/lifestyle modification  -May need to consider insulin the future if this does not help    HTN (hypertension) -Anti-hypertensives were resumed last night  DVT prophylaxis: Lovenox Code Status: Full code Family Communication: Spouse at bedside Disposition Plan: Home later today pending work-up  Consultants:   Neurology   Procedures:   Antimicrobials:    Subjective: Feels good, back to baseline  Objective: Vitals:   09/03/18 0230 09/03/18 0430 09/03/18 0634 09/03/18 0758  BP: (!) 179/96 (!) 165/82 (!) 166/84 (!) 164/84  Pulse: 74 61 61 81  Resp: 18 18 18 18   Temp:  98.3 F (36.8 C)  97.8 F (36.6 C)  TempSrc:  Oral  Oral  SpO2: 100% 98% 100% 98%  Weight:      Height:        No intake or output data in the 24 hours ending 09/03/18 1030 Filed Weights   09/01/18 1955 09/02/18 2055  Weight: 68.9 kg 65.7 kg    Examination:  General exam: Appears calm and comfortable, no distress Respiratory system: Clear to auscultation Cardiovascular system: S1 & S2 heard, RRR Gastrointestinal system: Abdomen is nondistended, soft and nontender.Normal bowel sounds heard. Central nervous system: Alert and oriented. No focal neurological deficits. Extremities: Symmetric 5 x 5 power. Skin: No rashes, lesions or ulcers Psychiatry: Judgement and insight appear normal. Mood & affect appropriate.     Data Reviewed:   CBC: Recent Labs  Lab 09/01/18 2026  WBC 4.6  NEUTROABS 2.5  HGB 13.1  HCT 40.1  MCV 89.7  PLT 211   Basic Metabolic Panel: Recent Labs  Lab 09/01/18 2026  NA 136  K 2.7*  CL 99  CO2 27  GLUCOSE 241*  BUN 12  CREATININE 0.84  CALCIUM 9.3  MG 1.7   GFR: Estimated Creatinine Clearance: 59.9 mL/min (by C-G formula based on SCr of 0.84 mg/dL). Liver Function Tests: Recent Labs  Lab 09/01/18 2026  AST 26  ALT 31  ALKPHOS 59  BILITOT 0.6  PROT 7.3  ALBUMIN 3.9   No results for input(s): LIPASE, AMYLASE in the last 168 hours. No results for input(s): AMMONIA in the last 168 hours. Coagulation Profile: Recent Labs  Lab 09/01/18 2026  INR 0.93   Cardiac Enzymes: Recent Labs  Lab 09/01/18 2026  TROPONINI <0.03   BNP (last 3 results) No results for input(s): PROBNP  in the last 8760 hours. HbA1C: Recent Labs    09/03/18 0456  HGBA1C 8.9*   CBG: Recent Labs  Lab 09/01/18 1951 09/02/18 2104 09/03/18 0625  GLUCAP 226* 108* 120*   Lipid Profile: Recent Labs    09/03/18 0456  CHOL 129  HDL 31*  LDLCALC 64  TRIG 147*  CHOLHDL 4.2   Thyroid Function Tests: No results for input(s): TSH, T4TOTAL, FREET4, T3FREE, THYROIDAB in the last 72 hours. Anemia Panel: No results for input(s): VITAMINB12, FOLATE, FERRITIN, TIBC,  IRON, RETICCTPCT in the last 72 hours. Urine analysis: No results found for: COLORURINE, APPEARANCEUR, LABSPEC, PHURINE, GLUCOSEU, HGBUR, BILIRUBINUR, KETONESUR, PROTEINUR, UROBILINOGEN, NITRITE, LEUKOCYTESUR Sepsis Labs: @LABRCNTIP (procalcitonin:4,lacticidven:4)  )No results found for this or any previous visit (from the past 240 hour(s)).       Radiology Studies: Dg Chest 2 View  Result Date: 09/02/2018 CLINICAL DATA:  Initial evaluation for acute TIA. EXAM: CHEST - 2 VIEW COMPARISON:  None. FINDINGS: The cardiac and mediastinal silhouettes are within normal limits. The lungs are normally inflated. Mild subsegmental atelectasis at the left lung base. No airspace consolidation, pleural effusion, or pulmonary edema is identified. There is no pneumothorax. No acute osseous abnormality identified. IMPRESSION: 1. Minimal subsegmental atelectasis at the left lung base. 2. No other active cardiopulmonary disease. Electronically Signed   By: Rise Mu M.D.   On: 09/02/2018 22:45   Mr Brain Wo Contrast  Result Date: 09/03/2018 CLINICAL DATA:  Initial evaluation for acute left-sided numbness. EXAM: MRI HEAD WITHOUT CONTRAST MRA HEAD WITHOUT CONTRAST TECHNIQUE: Multiplanar, multiecho pulse sequences of the brain and surrounding structures were obtained without intravenous contrast. Angiographic images of the head were obtained using MRA technique without contrast. COMPARISON:  Comparison made with prior CT from 09/01/2018 FINDINGS: MRI HEAD FINDINGS Brain: Generalized age-related cerebral atrophy. Mild chronic small vessel ischemic changes present within the periventricular white matter and pons. Small remote right cerebellar infarct noted. No abnormal foci of restricted diffusion to suggest acute or subacute ischemia. Gray-white matter differentiation maintained. No other areas of remote cortical infarction. No foci of susceptibility artifact to suggest acute or chronic intracranial  hemorrhage. No mass lesion, midline shift or mass effect. No hydrocephalus. No extra-axial fluid collection. Pituitary gland within normal limits. Vascular: Major intracranial vascular flow voids maintained. Skull and upper cervical spine: Craniocervical junction within normal limits. Visualized upper cervical spine unremarkable. No focal marrow replacing lesion. Scalp soft tissues unremarkable. Sinuses/Orbits: Globes and orbital soft tissues within normal limits. Several small retention cyst noted within the right maxillary sinus. Paranasal sinuses are otherwise clear. No significant mastoid effusion. Inner ear structures grossly normal. Other: None. MRA HEAD FINDINGS ANTERIOR CIRCULATION: Distal cervical segments of the internal carotid arteries are widely patent with antegrade flow. Petrous, cavernous, and supraclinoid segments patent without hemodynamically significant stenosis. ICA termini widely patent. A1 segments widely patent bilaterally. There is question of a tiny 2 mm focal outpouching extending from the left aspect of the anterior communicating artery complex (series 5, image 86), which could reflect a small aneurysm. Anterior cerebral arteries widely patent distally without stenosis. M1 segments widely patent bilaterally. Normal MCA bifurcations. No proximal M2 occlusion. Distal MCA branches well perfused and symmetric. Mild distal small vessel atheromatous irregularity. POSTERIOR CIRCULATION: Visualized vertebral arteries patent to the vertebrobasilar junction without stenosis. Left vertebral artery dominant. Posterior inferior cerebral arteries patent proximally. Basilar widely patent to its distal aspect without stenosis. Superior cerebral arteries patent bilaterally. Right PCA supplied via the basilar. Predominant fetal type origin  of the left PCA supplied via a widely patent left posterior communicating artery. PCAs widely patent to their distal aspects. Mild distal small vessel atheromatous  irregularity. IMPRESSION: MRI HEAD IMPRESSION: 1. No acute intracranial infarct or other abnormality identified. 2. Small remote right cerebellar infarct. 3. Underlying mild chronic small vessel ischemic disease for age. MRA HEAD IMPRESSION: 1. Question 2 mm focal outpouching arising from the left anterior communicating artery complex, suspicious for a possible small aneurysm. 2. Mild distal small vessel atheromatous irregularity throughout the intracranial circulation. 3. Otherwise unremarkable intracranial MRA. No large vessel occlusion. No hemodynamically significant or correctable stenosis. Electronically Signed   By: Rise Mu M.D.   On: 09/03/2018 01:01   Mr Maxine Glenn Head Wo Contrast  Result Date: 09/03/2018 CLINICAL DATA:  Initial evaluation for acute left-sided numbness. EXAM: MRI HEAD WITHOUT CONTRAST MRA HEAD WITHOUT CONTRAST TECHNIQUE: Multiplanar, multiecho pulse sequences of the brain and surrounding structures were obtained without intravenous contrast. Angiographic images of the head were obtained using MRA technique without contrast. COMPARISON:  Comparison made with prior CT from 09/01/2018 FINDINGS: MRI HEAD FINDINGS Brain: Generalized age-related cerebral atrophy. Mild chronic small vessel ischemic changes present within the periventricular white matter and pons. Small remote right cerebellar infarct noted. No abnormal foci of restricted diffusion to suggest acute or subacute ischemia. Gray-white matter differentiation maintained. No other areas of remote cortical infarction. No foci of susceptibility artifact to suggest acute or chronic intracranial hemorrhage. No mass lesion, midline shift or mass effect. No hydrocephalus. No extra-axial fluid collection. Pituitary gland within normal limits. Vascular: Major intracranial vascular flow voids maintained. Skull and upper cervical spine: Craniocervical junction within normal limits. Visualized upper cervical spine unremarkable. No focal  marrow replacing lesion. Scalp soft tissues unremarkable. Sinuses/Orbits: Globes and orbital soft tissues within normal limits. Several small retention cyst noted within the right maxillary sinus. Paranasal sinuses are otherwise clear. No significant mastoid effusion. Inner ear structures grossly normal. Other: None. MRA HEAD FINDINGS ANTERIOR CIRCULATION: Distal cervical segments of the internal carotid arteries are widely patent with antegrade flow. Petrous, cavernous, and supraclinoid segments patent without hemodynamically significant stenosis. ICA termini widely patent. A1 segments widely patent bilaterally. There is question of a tiny 2 mm focal outpouching extending from the left aspect of the anterior communicating artery complex (series 5, image 86), which could reflect a small aneurysm. Anterior cerebral arteries widely patent distally without stenosis. M1 segments widely patent bilaterally. Normal MCA bifurcations. No proximal M2 occlusion. Distal MCA branches well perfused and symmetric. Mild distal small vessel atheromatous irregularity. POSTERIOR CIRCULATION: Visualized vertebral arteries patent to the vertebrobasilar junction without stenosis. Left vertebral artery dominant. Posterior inferior cerebral arteries patent proximally. Basilar widely patent to its distal aspect without stenosis. Superior cerebral arteries patent bilaterally. Right PCA supplied via the basilar. Predominant fetal type origin of the left PCA supplied via a widely patent left posterior communicating artery. PCAs widely patent to their distal aspects. Mild distal small vessel atheromatous irregularity. IMPRESSION: MRI HEAD IMPRESSION: 1. No acute intracranial infarct or other abnormality identified. 2. Small remote right cerebellar infarct. 3. Underlying mild chronic small vessel ischemic disease for age. MRA HEAD IMPRESSION: 1. Question 2 mm focal outpouching arising from the left anterior communicating artery complex,  suspicious for a possible small aneurysm. 2. Mild distal small vessel atheromatous irregularity throughout the intracranial circulation. 3. Otherwise unremarkable intracranial MRA. No large vessel occlusion. No hemodynamically significant or correctable stenosis. Electronically Signed   By: Rise Mu M.D.   On:  09/03/2018 01:01   Ct Head Code Stroke Wo Contrast  Result Date: 09/01/2018 CLINICAL DATA:  Code stroke. Acute onset left arm and facial numbness with speech disturbance. EXAM: CT HEAD WITHOUT CONTRAST TECHNIQUE: Contiguous axial images were obtained from the base of the skull through the vertex without intravenous contrast. COMPARISON:  None. FINDINGS: Brain: There is no evidence of acute infarct, intracranial hemorrhage, mass, midline shift, or extra-axial fluid collection. The ventricles and sulci are within normal limits for age. Minimal cerebral white matter hypoattenuation is nonspecific but likely reflects chronic small vessel ischemia. There is a small chronic infarct in the posterior right cerebellum. Vascular: Calcified atherosclerosis at the skull base. No hyperdense vessel. Skull: No fracture or focal osseous lesion. Sinuses/Orbits: Chronic right maxillary sinusitis with moderate mucosal thickening. Clear mastoid air cells. Unremarkable orbits. Other: None. ASPECTS [facility] Stroke Program Early CT Score) - Ganglionic level infarction (caudate, lentiform nuclei, internal capsule, insula, M1-M3 cortex): 7 - Supraganglionic infarction (M4-M6 cortex): 3 Total score (0-10 with 10 being normal): 10 IMPRESSION: 1. No evidence of acute intracranial abnormality. 2. ASPECTS is 10. 3. Small chronic right cerebellar infarct. These results were called by telephone at the time of interpretation on 09/01/2018 at 8:30 pm to Dr. Frederick Peers , who verbally acknowledged these results. Electronically Signed   By: Sebastian Ache M.D.   On: 09/01/2018 20:32        Scheduled Meds: . aspirin  300  mg Rectal Daily   Or  . aspirin  325 mg Oral Daily  . enoxaparin (LOVENOX) injection  40 mg Subcutaneous Q24H  . glipiZIDE  5 mg Oral QAC breakfast  . hydrochlorothiazide  25 mg Oral Daily  . [START ON 09/04/2018] Influenza vac split quadrivalent PF  0.5 mL Intramuscular Tomorrow-1000  . linagliptin  5 mg Oral Daily  . loratadine  10 mg Oral Daily  . metFORMIN  1,000 mg Oral BID WC  . potassium chloride SA  20 mEq Oral Daily  . simvastatin  40 mg Oral Daily  . valsartan  160 mg Oral Once   Continuous Infusions:   LOS: 0 days    Time spent:    Zannie Cove, MD [facility] Page via [email or web address], password [email or web address] After 7PM please contact night-coverage  09/03/2018, 10:30 AM

## 2018-09-03 NOTE — Progress Notes (Signed)
Patient discharged in stable condition with all belongings. She verbalized understanding of all discharge instructions and importance of follow up visits.  

## 2018-09-03 NOTE — Discharge Instructions (Signed)
Aspirin and Your Heart  Aspirin is a medicine that affects the way blood clots. Aspirin can be used to help reduce the risk of blood clots, heart attacks, and other heart-related problems.  Should I take aspirin?  Your health care provider will help you determine whether it is safe and beneficial for you to take aspirin daily. Taking aspirin daily may be beneficial if you:   Have had a heart attack or chest pain.   Have undergone open heart surgery such as coronary artery bypass surgery (CABG).   Have had coronary angioplasty.   Have experienced a stroke or transient ischemic attack (TIA).   Have peripheral vascular disease (PVD).   Have chronic heart rhythm problems such as atrial fibrillation.    Are there any risks of taking aspirin daily?  Daily use of aspirin can increase your risk of side effects. Some of these include:   Bleeding. Bleeding problems can be minor or serious. An example of a minor problem is a cut that does not stop bleeding. An example of a more serious problem is stomach bleeding or bleeding into the brain. Your risk of bleeding is increased if you are also taking non-steroidal anti-inflammatory medicine (NSAIDs).   Increased bruising.   Upset stomach.   An allergic reaction. People who have nasal polyps have an increased risk of developing an aspirin allergy.    What are some guidelines I should follow when taking aspirin?   Take aspirin only as directed by your health care provider. Make sure you understand how much you should take and what form you should take. The two forms of aspirin are:  ? Non-enteric-coated. This type of aspirin does not have a coating and is absorbed quickly. Non-enteric-coated aspirin is usually recommended for people with chest pain. This type of aspirin also comes in a chewable form.  ? Enteric-coated. This type of aspirin has a special coating that releases the medicine very slowly. Enteric-coated aspirin causes less stomach upset than  non-enteric-coated aspirin. This type of aspirin should not be chewed or crushed.   Drink alcohol in moderation. Drinking alcohol increases your risk of bleeding.  When should I seek medical care?   You have unusual bleeding or bruising.   You have stomach pain.   You have an allergic reaction. Symptoms of an allergic reaction include:  ? Hives.  ? Itchy skin.  ? Swelling of the lips, tongue, or face.   You have ringing in your ears.  When should I seek immediate medical care?   Your bowel movements are bloody, dark red, or black in color.   You vomit or cough up blood.   You have blood in your urine.   You cough, wheeze, or feel short of breath.  If you have any of the following symptoms, this is an emergency. Do not wait to see if the pain will go away. Get medical help at once. Call your local emergency services (911 in the [place] Do not drive yourself to the hospital.   You have severe chest pain, especially if the pain is crushing or pressure-like and spreads to the arms, back, neck, or jaw.   You have stroke-like symptoms, such as:  ? Loss of vision.  ? Difficulty talking.  ? Numbness or weakness on one side of your body.  ? Numbness or weakness in your arm or leg.  ? Not thinking clearly or feeling confused.    This information is not intended to replace advice given to you   by your health care provider. Make sure you discuss any questions you have with your health care provider.  Document Released: 10/03/2008 Document Revised: 02/28/2016 Document Reviewed: 01/26/2014  Elsevier Interactive Patient Education  2018 [employer]

## 2018-09-03 NOTE — Progress Notes (Signed)
  Echocardiogram 2D Echocardiogram has been performed.  Tye Savoy 09/03/2018, 10:06 AM

## 2018-09-03 NOTE — Plan of Care (Signed)
  Problem: Education: Goal: Knowledge of General Education information will improve Description Including pain rating scale, medication(s)/side effects and non-pharmacologic comfort measures Outcome: Progressing   Problem: Health Behavior/Discharge Planning: Goal: Ability to manage health-related needs will improve Outcome: Progressing   Problem: Clinical Measurements: Goal: Ability to maintain clinical measurements within normal limits will improve Outcome: Progressing Goal: Will remain free from infection Outcome: Progressing Goal: Diagnostic test results will improve Outcome: Progressing Goal: Respiratory complications will improve Outcome: Progressing Goal: Cardiovascular complication will be avoided Outcome: Progressing   Problem: Activity: Goal: Risk for activity intolerance will decrease Outcome: Progressing   Problem: Nutrition: Goal: Adequate nutrition will be maintained Outcome: Progressing   Problem: Coping: Goal: Level of anxiety will decrease Outcome: Progressing   Problem: Elimination: Goal: Will not experience complications related to bowel motility Outcome: Progressing Goal: Will not experience complications related to urinary retention Outcome: Progressing   Problem: Pain Managment: Goal: General experience of comfort will improve Outcome: Progressing   Problem: Safety: Goal: Ability to remain free from injury will improve Outcome: Progressing   Problem: Skin Integrity: Goal: Risk for impaired skin integrity will decrease Outcome: Progressing   Problem: Education: Goal: Knowledge of disease or condition will improve Outcome: Progressing Goal: Knowledge of secondary prevention will improve Outcome: Progressing Goal: Knowledge of patient specific risk factors addressed and post discharge goals established will improve Outcome: Progressing Goal: Individualized Educational Video(s) Outcome: Progressing   Problem: Ischemic Stroke/TIA Tissue  Perfusion: Goal: Complications of ischemic stroke/TIA will be minimized Outcome: Progressing   

## 2018-09-03 NOTE — Discharge Summary (Signed)
Physician Discharge Summary  Samantha Wise ZOX:[id number] DOB: 21-Jul-1951 DOA: 09/01/2018  PCP: System, Pcp Not In  Admit date: 09/01/2018 Discharge date: 09/03/2018  Time spent: 35 minutes  Recommendations for Outpatient Follow-up:  Neurology Dr.Sethi in 1 month PCP in week  Discharge Diagnoses:  Principal Problem:   TIA (transient ischemic attack) Active Problems:   DM2 (diabetes mellitus, type 2) (HCC)   HTN (hypertension)   Discharge Condition: stable  Diet recommendation: DM heart healthy  Filed Weights   09/01/18 1955 09/02/18 2055  Weight: 68.9 kg 65.7 kg    History of present illness:  Samantha Weatheringtonis a 67 y.o.femalewith medical history significant ofDM2, HTN. Patient presented to ED at [facility] on 10/29 evening with c/o sudden onset of numbness of L face and L arm, associated L facial droop and difficulty with speech. Symptoms onset 7pm on 10/29, resolved by time she was evaluated in ED.  Hospital Course:   Facial and left arm numbness, slurred speech -Transient, resolved, suspect TIA -MRI brain without any acute findings, evidence of prior lacunar infarct noted -MRA without any large vessel occlusions, ?2 mm focal outpouching arising from the left anterior communicating artery complex, suspicious for a possible small aneurysm, will need FU -Neuro consulted and recommended ASA 81mg  and Plavix 75mg  daily for 3 weeks followed by Plavix alone -Carotid duplex without significant stenosis -2D echocardiogram with preserved EF and no significant valvular disease -LDL was 64, hemoglobin A1c was 8.9 -Back to baseline -advised to FU with Neurology Dr.Sethi in 24month    DM2 (diabetes mellitus, type 2) (HCC) -On metformin, glipizide and Januvia -Has had dietary indiscretion recently, suspect this accounts for her spike in her A1c from 7 to 8.9 -Advised continued weight loss, diet/lifestyle modification  -May need to consider insulin the future if  this does not help    HTN (hypertension) -Anti-hypertensives were resumed last night  Consultations:  Neurology  Discharge Exam: Vitals:   09/03/18 1140 09/03/18 1225  BP: (!) 150/111 (!) 164/108  Pulse: (!) 102 (!) 103  Resp: 18   Temp: 98.3 F (36.8 C)   SpO2: 98%     General: AAOx3 Cardiovascular: S1S2/RRR Respiratory: CTAB  Discharge Instructions   Discharge Instructions    Ambulatory referral to Physical Therapy   Complete by:  As directed    Diet - low sodium heart healthy   Complete by:  As directed    Diet Carb Modified   Complete by:  As directed    Increase activity slowly   Complete by:  As directed      Allergies as of 09/03/2018      Reactions   Other    "an abx"   Shellfish Allergy       Medication List    TAKE these medications   aspirin 81 MG EC tablet Take 1 tablet (81 mg total) by mouth daily. Start taking on:  09/04/2018   cetirizine 10 MG tablet Commonly known as:  ZYRTEC Take 10 mg by mouth daily.   clopidogrel 75 MG tablet Commonly known as:  PLAVIX Take 1 tablet (75 mg total) by mouth daily for 21 days. Start taking on:  09/04/2018   estradiol 0.06 MG/24HR Commonly known as:  CLIMARA Place 1 patch onto the skin once a week.   glipiZIDE 5 MG tablet Commonly known as:  GLUCOTROL Take 5 mg by mouth 2 (two) times daily before a meal.   metFORMIN 1000 MG tablet Commonly known as:  GLUCOPHAGE Take 1,000 mg by  mouth 2 (two) times daily with a meal.   potassium chloride SA 20 MEQ tablet Commonly known as:  K-DUR,KLOR-CON Take 20 mEq by mouth daily.   simvastatin 40 MG tablet Commonly known as:  ZOCOR Take 40 mg by mouth daily at 6 PM.   sitaGLIPtin 100 MG tablet Commonly known as:  JANUVIA Take 100 mg by mouth daily.   valsartan-hydrochlorothiazide 160-25 MG tablet Commonly known as:  DIOVAN-HCT Take 1 tablet by mouth daily.      Allergies  Allergen Reactions  . Other     "an abx"  . Shellfish Allergy     Follow-up Information    [facility] Follow up.   Specialty:  Rehabilitation Why:  They will contact you for the first appointment Contact information: [address]  [address] [id number] [employer] [place] [address] [phone]       PCP. Schedule an appointment as soon as possible for a visit in 1 week(s).        Micki Riley, MD. Schedule an appointment as soon as possible for a visit in 1 month(s).   Specialties:  Neurology, Radiology Contact information: [address] [place] [address] [phone]            The results of significant diagnostics from this hospitalization (including imaging, microbiology, ancillary and laboratory) are listed below for reference.    Significant Diagnostic Studies: Dg Chest 2 View  Result Date: 09/02/2018 CLINICAL DATA:  Initial evaluation for acute TIA. EXAM: CHEST - 2 VIEW COMPARISON:  None. FINDINGS: The cardiac and mediastinal silhouettes are within normal limits. The lungs are normally inflated. Mild subsegmental atelectasis at the left lung base. No airspace consolidation, pleural effusion, or pulmonary edema is identified. There is no pneumothorax. No acute osseous abnormality identified. IMPRESSION: 1. Minimal subsegmental atelectasis at the left lung base. 2. No other active cardiopulmonary disease. Electronically Signed   By: Rise Mu M.D.   On: 09/02/2018 22:45   Mr Brain Wo Contrast  Result Date: 09/03/2018 CLINICAL DATA:  Initial evaluation for acute left-sided numbness. EXAM: MRI HEAD WITHOUT CONTRAST MRA HEAD WITHOUT CONTRAST TECHNIQUE: Multiplanar, multiecho pulse sequences of the brain and surrounding structures were obtained without intravenous contrast. Angiographic images of the head were obtained using MRA technique without contrast. COMPARISON:  Comparison made with prior CT from 09/01/2018 FINDINGS: MRI HEAD FINDINGS Brain:  Generalized age-related cerebral atrophy. Mild chronic small vessel ischemic changes present within the periventricular white matter and pons. Small remote right cerebellar infarct noted. No abnormal foci of restricted diffusion to suggest acute or subacute ischemia. Gray-white matter differentiation maintained. No other areas of remote cortical infarction. No foci of susceptibility artifact to suggest acute or chronic intracranial hemorrhage. No mass lesion, midline shift or mass effect. No hydrocephalus. No extra-axial fluid collection. Pituitary gland within normal limits. Vascular: Major intracranial vascular flow voids maintained. Skull and upper cervical spine: Craniocervical junction within normal limits. Visualized upper cervical spine unremarkable. No focal marrow replacing lesion. Scalp soft tissues unremarkable. Sinuses/Orbits: Globes and orbital soft tissues within normal limits. Several small retention cyst noted within the right maxillary sinus. Paranasal sinuses are otherwise clear. No significant mastoid effusion. Inner ear structures grossly normal. Other: None. MRA HEAD FINDINGS ANTERIOR CIRCULATION: Distal cervical segments of the internal carotid arteries are widely patent with antegrade flow. Petrous, cavernous, and supraclinoid segments patent without hemodynamically significant stenosis. ICA termini widely patent. A1 segments widely patent bilaterally. There is question of  a tiny 2 mm focal outpouching extending from the left aspect of the anterior communicating artery complex (series 5, image 86), which could reflect a small aneurysm. Anterior cerebral arteries widely patent distally without stenosis. M1 segments widely patent bilaterally. Normal MCA bifurcations. No proximal M2 occlusion. Distal MCA branches well perfused and symmetric. Mild distal small vessel atheromatous irregularity. POSTERIOR CIRCULATION: Visualized vertebral arteries patent to the vertebrobasilar junction without  stenosis. Left vertebral artery dominant. Posterior inferior cerebral arteries patent proximally. Basilar widely patent to its distal aspect without stenosis. Superior cerebral arteries patent bilaterally. Right PCA supplied via the basilar. Predominant fetal type origin of the left PCA supplied via a widely patent left posterior communicating artery. PCAs widely patent to their distal aspects. Mild distal small vessel atheromatous irregularity. IMPRESSION: MRI HEAD IMPRESSION: 1. No acute intracranial infarct or other abnormality identified. 2. Small remote right cerebellar infarct. 3. Underlying mild chronic small vessel ischemic disease for age. MRA HEAD IMPRESSION: 1. Question 2 mm focal outpouching arising from the left anterior communicating artery complex, suspicious for a possible small aneurysm. 2. Mild distal small vessel atheromatous irregularity throughout the intracranial circulation. 3. Otherwise unremarkable intracranial MRA. No large vessel occlusion. No hemodynamically significant or correctable stenosis. Electronically Signed   By: Rise Mu M.D.   On: 09/03/2018 01:01   Mr Maxine Glenn Head Wo Contrast  Result Date: 09/03/2018 CLINICAL DATA:  Initial evaluation for acute left-sided numbness. EXAM: MRI HEAD WITHOUT CONTRAST MRA HEAD WITHOUT CONTRAST TECHNIQUE: Multiplanar, multiecho pulse sequences of the brain and surrounding structures were obtained without intravenous contrast. Angiographic images of the head were obtained using MRA technique without contrast. COMPARISON:  Comparison made with prior CT from 09/01/2018 FINDINGS: MRI HEAD FINDINGS Brain: Generalized age-related cerebral atrophy. Mild chronic small vessel ischemic changes present within the periventricular white matter and pons. Small remote right cerebellar infarct noted. No abnormal foci of restricted diffusion to suggest acute or subacute ischemia. Gray-white matter differentiation maintained. No other areas of remote  cortical infarction. No foci of susceptibility artifact to suggest acute or chronic intracranial hemorrhage. No mass lesion, midline shift or mass effect. No hydrocephalus. No extra-axial fluid collection. Pituitary gland within normal limits. Vascular: Major intracranial vascular flow voids maintained. Skull and upper cervical spine: Craniocervical junction within normal limits. Visualized upper cervical spine unremarkable. No focal marrow replacing lesion. Scalp soft tissues unremarkable. Sinuses/Orbits: Globes and orbital soft tissues within normal limits. Several small retention cyst noted within the right maxillary sinus. Paranasal sinuses are otherwise clear. No significant mastoid effusion. Inner ear structures grossly normal. Other: None. MRA HEAD FINDINGS ANTERIOR CIRCULATION: Distal cervical segments of the internal carotid arteries are widely patent with antegrade flow. Petrous, cavernous, and supraclinoid segments patent without hemodynamically significant stenosis. ICA termini widely patent. A1 segments widely patent bilaterally. There is question of a tiny 2 mm focal outpouching extending from the left aspect of the anterior communicating artery complex (series 5, image 86), which could reflect a small aneurysm. Anterior cerebral arteries widely patent distally without stenosis. M1 segments widely patent bilaterally. Normal MCA bifurcations. No proximal M2 occlusion. Distal MCA branches well perfused and symmetric. Mild distal small vessel atheromatous irregularity. POSTERIOR CIRCULATION: Visualized vertebral arteries patent to the vertebrobasilar junction without stenosis. Left vertebral artery dominant. Posterior inferior cerebral arteries patent proximally. Basilar widely patent to its distal aspect without stenosis. Superior cerebral arteries patent bilaterally. Right PCA supplied via the basilar. Predominant fetal type origin of the left PCA supplied via a widely patent left posterior  communicating artery. PCAs widely patent to their distal aspects. Mild distal small vessel atheromatous irregularity. IMPRESSION: MRI HEAD IMPRESSION: 1. No acute intracranial infarct or other abnormality identified. 2. Small remote right cerebellar infarct. 3. Underlying mild chronic small vessel ischemic disease for age. MRA HEAD IMPRESSION: 1. Question 2 mm focal outpouching arising from the left anterior communicating artery complex, suspicious for a possible small aneurysm. 2. Mild distal small vessel atheromatous irregularity throughout the intracranial circulation. 3. Otherwise unremarkable intracranial MRA. No large vessel occlusion. No hemodynamically significant or correctable stenosis. Electronically Signed   By: Rise Mu M.D.   On: 09/03/2018 01:01   Ct Head Code Stroke Wo Contrast  Result Date: 09/01/2018 CLINICAL DATA:  Code stroke. Acute onset left arm and facial numbness with speech disturbance. EXAM: CT HEAD WITHOUT CONTRAST TECHNIQUE: Contiguous axial images were obtained from the base of the skull through the vertex without intravenous contrast. COMPARISON:  None. FINDINGS: Brain: There is no evidence of acute infarct, intracranial hemorrhage, mass, midline shift, or extra-axial fluid collection. The ventricles and sulci are within normal limits for age. Minimal cerebral white matter hypoattenuation is nonspecific but likely reflects chronic small vessel ischemia. There is a small chronic infarct in the posterior right cerebellum. Vascular: Calcified atherosclerosis at the skull base. No hyperdense vessel. Skull: No fracture or focal osseous lesion. Sinuses/Orbits: Chronic right maxillary sinusitis with moderate mucosal thickening. Clear mastoid air cells. Unremarkable orbits. Other: None. ASPECTS [facility] Stroke Program Early CT Score) - Ganglionic level infarction (caudate, lentiform nuclei, internal capsule, insula, M1-M3 cortex): 7 - Supraganglionic infarction (M4-M6 cortex):  3 Total score (0-10 with 10 being normal): 10 IMPRESSION: 1. No evidence of acute intracranial abnormality. 2. ASPECTS is 10. 3. Small chronic right cerebellar infarct. These results were called by telephone at the time of interpretation on 09/01/2018 at 8:30 pm to Dr. Frederick Peers , who verbally acknowledged these results. Electronically Signed   By: Sebastian Ache M.D.   On: 09/01/2018 20:32    Microbiology: No results found for this or any previous visit (from the past 240 hour(s)).   Labs: Basic Metabolic Panel: Recent Labs  Lab 09/01/18 2026  NA 136  K 2.7*  CL 99  CO2 27  GLUCOSE 241*  BUN 12  CREATININE 0.84  CALCIUM 9.3  MG 1.7   Liver Function Tests: Recent Labs  Lab 09/01/18 2026  AST 26  ALT 31  ALKPHOS 59  BILITOT 0.6  PROT 7.3  ALBUMIN 3.9   No results for input(s): LIPASE, AMYLASE in the last 168 hours. No results for input(s): AMMONIA in the last 168 hours. CBC: Recent Labs  Lab 09/01/18 2026  WBC 4.6  NEUTROABS 2.5  HGB 13.1  HCT 40.1  MCV 89.7  PLT 211   Cardiac Enzymes: Recent Labs  Lab 09/01/18 2026  TROPONINI <0.03   BNP: BNP (last 3 results) No results for input(s): BNP in the last 8760 hours.  ProBNP (last 3 results) No results for input(s): PROBNP in the last 8760 hours.  CBG: Recent Labs  Lab 09/01/18 1951 09/02/18 2104 09/03/18 0625 09/03/18 1152  GLUCAP 226* 108* 120* 169*       Signed:  Zannie Cove MD.  [facility] 09/03/2018, 1:53 PM

## 2018-09-03 NOTE — Evaluation (Signed)
Occupational Therapy Evaluation Patient Details Name: Samantha Wise MRN: [id number] DOB: 1951/08/31 Today's Date: 09/03/2018    History of Present Illness 67yo female c/o L UE and facial numbness, L facial droop, and difficulty with speech. CT negative for acute changes. Admitted for TIA workup. PMH DM, HTN    Clinical Impression   This 67 y/o female presents with the above. At baseline pt is independent with ADLs and functional mobility. Pt performing functional mobility including higher level balance challenges, standing grooming, UB and LB ADLs without AD and with supervision throughout this session, no LOB noted. Pt reports feeling at her baseline regarding ADL and mobility completion. Reports she will return home with family who are able to provide assist with iADLs PRN. Reviewed and educated pt/pt's family on s/s of CVA including BEFAST acronym. Feel pt is safe to return home from OT standpoint once medically ready. Education provided and questions answered throughout session with no further OT needs identified at this time. Acute OT to sign off, thank you for this referral.     Follow Up Recommendations  No OT follow up;Supervision - Intermittent    Equipment Recommendations  None recommended by OT           Precautions / Restrictions Precautions Precautions: None Restrictions Weight Bearing Restrictions: No      Mobility Bed Mobility               General bed mobility comments: pt sitting EOB upon entry  Transfers Overall transfer level: Modified independent               General transfer comment: received standing at sink     Balance Overall balance assessment: No apparent balance deficits (not formally assessed)                                         ADL either performed or assessed with clinical judgement   ADL Overall ADL's : At baseline                                       General ADL Comments: pt  performed standing grooming, UB and LB ADLs, hallway level mobility including higher level balance challenges without AD and with overall supervision throughout. pt reports feeling at her baseline regarding ADL and mobility completion      Vision Baseline Vision/History: Wears glasses Wears Glasses: Reading only Patient Visual Report: No change from baseline Vision Assessment?: No apparent visual deficits     Perception     Praxis      Pertinent Vitals/Pain Pain Assessment: No/denies pain     Hand Dominance Right   Extremity/Trunk Assessment Upper Extremity Assessment Upper Extremity Assessment: Overall WFL for tasks assessed   Lower Extremity Assessment Lower Extremity Assessment: Overall WFL for tasks assessed   Cervical / Trunk Assessment Cervical / Trunk Assessment: Normal   Communication Communication Communication: No difficulties   Cognition Arousal/Alertness: Awake/alert Behavior During Therapy: WFL for tasks assessed/performed Overall Cognitive Status: Within Functional Limits for tasks assessed                                     General Comments       Exercises  Shoulder Instructions      Home Living Family/patient expects to be discharged to:: Private residence Living Arrangements: Children;Spouse/significant other Available Help at Discharge: Family;Available PRN/intermittently Type of Home: House Home Access: Level entry     Home Layout: Multi-level Alternate Level Stairs-Number of Steps: currently lives with son until they move to their new home in [place]; tri-level house with multiple flights of stairs  Alternate Level Stairs-Rails: Can reach both Bathroom Shower/Tub: Tub/shower unit;Walk-in shower   Bathroom Toilet: Standard     Home Equipment: None          Prior Functioning/Environment Level of Independence: Independent                 OT Problem List: Decreased activity tolerance;Decreased strength       OT Treatment/Interventions:      OT Goals(Current goals can be found in the care plan section) Acute Rehab OT Goals Patient Stated Goal: hopeful for home today OT Goal Formulation: All assessment and education complete, DC therapy  OT Frequency:     Barriers to D/C:            Co-evaluation              AM-PAC PT "6 Clicks" Daily Activity     Outcome Measure Help from another person eating meals?: None Help from another person taking care of personal grooming?: None Help from another person toileting, which includes using toliet, bedpan, or urinal?: None Help from another person bathing (including washing, rinsing, drying)?: None Help from another person to put on and taking off regular upper body clothing?: None Help from another person to put on and taking off regular lower body clothing?: None 6 Click Score: 24   End of Session Equipment Utilized During Treatment: Gait belt Nurse Communication: Mobility status  Activity Tolerance: Patient tolerated treatment well Patient left: with call bell/phone within reach;with nursing/sitter in room;with family/visitor present;Other (comment)(seated EOB)  OT Visit Diagnosis: Other symptoms and signs involving the nervous system (Z61.096)                Time: 0454-0981 OT Time Calculation (min): 16 min Charges:  OT General Charges $OT Visit: 1 Visit OT Evaluation $OT Eval Low Complexity: 1 Low  Marcy Siren, OT [facility] Rehabilitation Services Pager [phone] Office [phone]   Orlando Penner 09/03/2018, 11:39 AM

## 2018-09-03 NOTE — Evaluation (Signed)
Physical Therapy Evaluation Patient Details Name: Geneva Barrero MRN: [id number] DOB: Nov 15, 1950 Today's Date: 09/03/2018   History of Present Illness  67yo female c/o L UE and facial numbness, L facial droop, and difficulty with speech. CT negative for acute changes. Admitted for TIA workup. PMH DM, HTN   Clinical Impression   Patient received standing at sink, very pleasant and willing to work with PT today. Able to ambulate approximately 460f with no device and Mod(I), also able to ascend/descend full flight of stairs with U railing and Mod(I); note she demonstrates occasional mild buckling R knee with stair descent, able to correct independently and without risk of fall, but reports that this has been ongoing and getting worse over time. Per functional assessment and per her report, she appears to have returned to her baseline level of function. She was left in her room standing at sink preparing to perform self-care. She is not in need of skilled PT services in the acute setting, however do recommend skilled OP PT services to address R knee weakness and improve stair navigation. PT signing off- thank you for the referral.     Follow Up Recommendations Outpatient PT(ortho clinic for R knee weakness/difficulty with stairs )    Equipment Recommendations  None recommended by PT    Recommendations for Other Services       Precautions / Restrictions Precautions Precautions: None Restrictions Weight Bearing Restrictions: No      Mobility  Bed Mobility               General bed mobility comments: received standing at sink   Transfers                 General transfer comment: received standing at sink   Ambulation/Gait Ambulation/Gait assistance: Modified independent (Device/Increase time) Gait Distance (Feet): 450 Feet Assistive device: None Gait Pattern/deviations: WFL(Within Functional Limits);Step-through pattern   Gait velocity interpretation: >2.62  ft/sec, indicative of community ambulatory General Gait Details: gait pattern generally WNL, no significant gait deficit or LOB/unsteadiness noted   Stairs Stairs: Yes Stairs assistance: Modified independent (Device/Increase time) Stair Management: One rail Right;Alternating pattern;Forwards Number of Stairs: 12 General stair comments: mild antalgic pattern/difficulty with stairs due to ongoing R knee weakness   Wheelchair Mobility    Modified Rankin (Stroke Patients Only) Modified Rankin (Stroke Patients Only) Pre-Morbid Rankin Score: No symptoms Modified Rankin: No symptoms     Balance Overall balance assessment: Independent                                           Pertinent Vitals/Pain Pain Assessment: No/denies pain    Home Living Family/patient expects to be discharged to:: Private residence Living Arrangements: Children;Spouse/significant other Available Help at Discharge: Family;Available PRN/intermittently Type of Home: House Home Access: Level entry     Home Layout: Multi-level Home Equipment: None      Prior Function Level of Independence: Independent               Hand Dominance        Extremity/Trunk Assessment   Upper Extremity Assessment Upper Extremity Assessment: Overall WFL for tasks assessed    Lower Extremity Assessment Lower Extremity Assessment: Overall WFL for tasks assessed    Cervical / Trunk Assessment Cervical / Trunk Assessment: Normal  Communication   Communication: No difficulties  Cognition Arousal/Alertness: Awake/alert Behavior During Therapy: WFL for tasks  assessed/performed Overall Cognitive Status: Within Functional Limits for tasks assessed                                        General Comments      Exercises     Assessment/Plan    PT Assessment All further PT needs can be met in the next venue of care  PT Problem List Decreased strength       PT Treatment  Interventions      PT Goals (Current goals can be found in the Care Plan section)  Acute Rehab PT Goals Patient Stated Goal: to go home  PT Goal Formulation: With patient Time For Goal Achievement: 09/17/18 Potential to Achieve Goals: Good    Frequency     Barriers to discharge        Co-evaluation               AM-PAC PT "6 Clicks" Daily Activity  Outcome Measure Difficulty turning over in bed (including adjusting bedclothes, sheets and blankets)?: None Difficulty moving from lying on back to sitting on the side of the bed? : None Difficulty sitting down on and standing up from a chair with arms (e.g., wheelchair, bedside commode, etc,.)?: None Help needed moving to and from a bed to chair (including a wheelchair)?: None Help needed walking in hospital room?: None Help needed climbing 3-5 steps with a railing? : A Little 6 Click Score: 23    End of Session   Activity Tolerance: Patient tolerated treatment well Patient left: with call bell/phone within reach;Other (comment)(standing at sink getting ready to apply dry shampoo cap ) Nurse Communication: Mobility status PT Visit Diagnosis: Muscle weakness (generalized) (M62.81)    Time: 5056-9794 PT Time Calculation (min) (ACUTE ONLY): 13 min   Charges:   PT Evaluation $PT Eval Low Complexity: 1 Low          Deniece Ree PT, DPT, CBIS  Supplemental Physical Therapist [place]    Pager [phone] Acute Rehab Office [phone]

## 2018-09-16 ENCOUNTER — Other Ambulatory Visit: Payer: Self-pay

## 2018-09-16 ENCOUNTER — Encounter: Payer: Self-pay | Admitting: Physical Therapy

## 2018-09-16 ENCOUNTER — Ambulatory Visit: Payer: Medicare Other | Attending: Internal Medicine | Admitting: Physical Therapy

## 2018-09-16 DIAGNOSIS — R262 Difficulty in walking, not elsewhere classified: Secondary | ICD-10-CM | POA: Insufficient documentation

## 2018-09-16 DIAGNOSIS — M6281 Muscle weakness (generalized): Secondary | ICD-10-CM

## 2018-09-16 DIAGNOSIS — M25561 Pain in right knee: Secondary | ICD-10-CM | POA: Insufficient documentation

## 2018-09-16 DIAGNOSIS — G8929 Other chronic pain: Secondary | ICD-10-CM | POA: Diagnosis present

## 2018-09-16 NOTE — Therapy (Signed)
[facility] [address]  [address] [place], [place], [address] Phone: [phone]   Fax:  [phone]  Physical Therapy Evaluation  Patient Details  Name: Samantha Wise MRN: [id number] Date of Birth: Nov 28, 1950 Referring Provider (PT): Theodoro Kos, MD Zannie Cove, MD is ER doctor)   Encounter Date: 09/16/2018  PT End of Session - 09/16/18 1012    Visit Number  1    Number of Visits  7    Date for PT Re-Evaluation  10/28/18    Authorization Type  Medicare    PT Start Time  0923    PT Stop Time  1004    PT Time Calculation (min)  41 min    Activity Tolerance  Patient tolerated treatment well    Behavior During Therapy  [facility] for tasks assessed/performed       Past Medical History:  Diagnosis Date  . Diabetes mellitus without complication (HCC)   . Hypertension     Past Surgical History:  Procedure Laterality Date  . ABDOMINAL HYSTERECTOMY    . CHOLECYSTECTOMY      There were no vitals filed for this visit.   Subjective Assessment - 09/16/18 0851    Subjective  Patient reports she was hospitalized 09/01/30 - 09/03/18 for a TIA. Reports she has not had any lasting effects from TIA- no lasting weakness or N/T. Acute care PT recommended OPPT as patient was having trouble with R knee buckling and stair navigation. Patient reports she has intermittent R knee buckling starting 4 years ago, but this has not happened in months now. When this occurs, patient reports she has pain under kneecap and she is unable to put weight through her R foot. Has not fallen to the floor from this and has able to catch herself. Oftentimes this happens when she is trying to turn. Reports she goes up/down stairs with step- to pattern with handrail and is hesitant with this. At baseline, she has no knee pain.     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 Small remote right cerebellar infarct.    Patient Stated Goals  going down stairs step over step    Currently in Pain?  No/denies    Pain Score  0-No pain    Pain Location  Knee    Pain Orientation  Right    Pain Descriptors / Indicators  Sharp    Pain Type  Chronic pain         [facility] PT Assessment - 09/16/18 0859      Assessment   Medical Diagnosis  TIA    Referring Provider (PT)  Theodoro Kos, MD   Zannie Cove, MD is ER doctor   Onset Date/Surgical Date  --   4 years ago   Next MD Visit  --   not scheduled   Prior Therapy  Yes- shoudler      Precautions   Precautions  None      Restrictions   Weight Bearing Restrictions  No      Balance Screen   Has the patient fallen in the past 6 months  No    Has the patient had a decrease in activity level because of a fear of falling?  No    Is the patient reluctant to leave their home because of a fear of falling?   No      Home Public house manager residence    Living Arrangements  Spouse/significant other    Available Help at Discharge  Family    Type of Home  House    Home Access  Level entry    Home Layout  Two level    Alternate Level Stairs-Number of Steps  14    Alternate Level Stairs-Rails  Right;Left      Prior Function   Level of Independence  Independent    Vocation  Retired    Leisure  none      Cognition   Overall Cognitive Status  Within Functional Limits for tasks assessed      Observation/Other Assessments   Focus on Therapeutic Outcomes (FOTO)   next session      Sensation   Light Touch  Appears Intact      Coordination   Gross Motor Movements are Fluid and Coordinated  Yes      Posture/Postural Control   Posture/Postural Control  Postural  limitations    Postural Limitations  Rounded Shoulders;Forward head      ROM / Strength   AROM / PROM / Strength  AROM;PROM;Strength      AROM   AROM Assessment Site  Knee    Right/Left Knee  Left;Right    Right Knee Extension  -2    Right Knee Flexion  140   mild discomfort in knee   Left Knee Extension  -2    Left Knee Flexion  130      PROM   PROM Assessment Site  Knee    Right/Left Knee  Left;Right    Right Knee Extension  -2    Right Knee Flexion  140   mild discomfort in knee   Left Knee Extension  -2    Left Knee Flexion  136      Strength   Strength Assessment Site  Hip;Knee;Ankle    Right/Left Hip  Right;Left    Right Hip Flexion  4+/5    Right Hip ABduction  4+/5    Right Hip ADduction  4+/5    Left Hip Flexion  4/5    Left Hip ABduction  4+/5    Left Hip ADduction  4+/5    Right/Left Knee  Right;Left    Right Knee Flexion  4/5    Right Knee Extension  4-/5   4/10 pain in knee   Left Knee Flexion  4+/5    Left Knee Extension  4/5    Right/Left Ankle  Right;Left    Right Ankle Dorsiflexion  4+/5    Right Ankle Plantar Flexion  4+/5    Left Ankle Dorsiflexion  4+/5    Left Ankle Plantar Flexion  4+/5      Flexibility   Soft Tissue Assessment /Muscle Length  yes    Hamstrings  B WFL    Quadriceps  mildly tight in mod thomas in B LEs    ITB  B WFL      Palpation   Patella mobility  good patellar mobility in all directions without pain    Palpation comment  no TPP      Ambulation/Gait   Gait Pattern  Within Functional Limits                Objective measurements completed on examination: See above findings.  PT Education - 09/16/18 1012    Education Details  prognosis, POC, HEP    Person(s) Educated  Patient    Methods  Explanation;Demonstration;Tactile cues;Verbal cues;Handout    Comprehension  Verbalized understanding;Returned demonstration       PT Short Term Goals - 09/16/18 1019      PT SHORT TERM GOAL  #1   Title  Patient to be independent with initial HEP.    Time  3    Period  Weeks    Status  New    Target Date  10/07/18        PT Long Term Goals - 09/16/18 1019      PT LONG TERM GOAL #1   Title  Patient to be independent with advanced HEP.     Time  6    Period  Weeks    Status  New    Target Date  10/28/18      PT LONG TERM GOAL #2   Title  Patient to demonstrate >=4+/5 strength in B LEs without pain limiting.     Time  6    Period  Weeks    Status  New    Target Date  10/28/18      PT LONG TERM GOAL #3   Title  Patient to demonstrate reciprocal stair climbing up/down 13 steps with 1 handrail with good stability and eccentric control.     Time  6    Period  Weeks    Status  New    Target Date  10/28/18      PT LONG TERM GOAL #4   Title  Patient to report no episodes of R knee buckling in last 3 weeks.     Time  6    Period  Weeks    Status  New    Target Date  10/28/18             Plan - 09/16/18 1013    Clinical Impression Statement  Patient is a 67y/o F presenting to OPPT with c/o intermittent R knee buckling for the last 4 years. Reports these buckling episodes have subsequently made her hesitant with stair climbing, as she uses a step-to pattern relying on L LE. Buckling has not occurred in months and denies falling to the ground. When buckling occurs, patient reports she has pain under her patella and is unable to put weight through her R foot. No knee pain at baseline. Patient today with excellent knee ROM, pain with resisted knee extension, mild flexibility deficits in hip flexors and quads. Educated on gentle strengthening and stretching HEP and advised not to push into pain. Patient reported understanding. Would benefit from skilled PT services 1x/week for 6 weeks to address aforementioned impairments.     Clinical Presentation  Stable    Clinical Decision Making  Low    Rehab Potential  Good    Clinical Impairments Affecting Rehab Potential  HTN,  DM, TIA    PT Frequency  1x / week    PT Duration  6 weeks    PT Treatment/Interventions  ADLs/Self Care Home Management;Cryotherapy;Electrical Stimulation;Iontophoresis 4mg /ml Dexamethasone;Moist Heat;Ultrasound;DME Instruction;Gait training;Stair training;Functional mobility training;Therapeutic activities;Therapeutic exercise;Manual techniques;Orthotic Fit/Training;Patient/family education;Neuromuscular re-education;Balance training;Passive range of motion;Dry needling;Energy conservation;Splinting;Taping;Vasopneumatic Device    PT Next Visit Plan  reassess HEP; assess stair climbing     Consulted and Agree with Plan of Care  Patient       Patient will benefit from skilled therapeutic intervention in order to improve  the following deficits and impairments:  Decreased strength, Pain, Decreased balance, Difficulty walking, Impaired flexibility, Postural dysfunction  Visit Diagnosis: Chronic pain of right knee  Muscle weakness (generalized)  Difficulty in walking, not elsewhere classified     Problem List Patient Active Problem List   Diagnosis Date Noted  . DM2 (diabetes mellitus, type 2) (HCC) 09/02/2018  . HTN (hypertension) 09/02/2018  . TIA (transient ischemic attack) 09/01/2018     Anette Guarneri, PT, DPT 09/16/18 10:22 AM   [facility] [address]  [address] [place], [place], [address] Phone: [phone]   Fax:  [phone]  Name: Nayelli Inglis MRN: [id number] Date of Birth: [birth year]

## 2018-09-23 ENCOUNTER — Ambulatory Visit: Payer: Medicare Other

## 2018-09-23 DIAGNOSIS — M6281 Muscle weakness (generalized): Secondary | ICD-10-CM

## 2018-09-23 DIAGNOSIS — M25561 Pain in right knee: Secondary | ICD-10-CM | POA: Diagnosis not present

## 2018-09-23 DIAGNOSIS — R262 Difficulty in walking, not elsewhere classified: Secondary | ICD-10-CM

## 2018-09-23 DIAGNOSIS — G8929 Other chronic pain: Secondary | ICD-10-CM

## 2018-09-23 NOTE — Therapy (Signed)
[facility] [address]  [address] [place], [place], [address] Phone: [phone]   Fax:  [phone]  Physical Therapy Treatment  Patient Details  Name: Samantha Wise MRN: [id number] Date of Birth: 04-25-1951 Referring Provider (PT): Theodoro KosWilliam B Lewis, MD Zannie Cove(Preetha Joseph, MD is ER doctor)   Encounter Date: 09/23/2018  PT End of Session - 09/23/18 0853    Visit Number  2    Number of Visits  7    Date for PT Re-Evaluation  10/28/18    Authorization Type  Medicare    PT Start Time  0847    PT Stop Time  0935    PT Time Calculation (min)  48 min    Activity Tolerance  Patient tolerated treatment well    Behavior During Therapy  [facility] for tasks assessed/performed       Past Medical History:  Diagnosis Date  . Diabetes mellitus without complication (HCC)   . Hypertension     Past Surgical History:  Procedure Laterality Date  . ABDOMINAL HYSTERECTOMY    . CHOLECYSTECTOMY      There were no vitals filed for this visit.  Subjective Assessment - 09/23/18 0851    Subjective  Pt. denies pain today and reports no recent buckling at R LE.      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 Small remote right cerebellar infarct.    Patient Stated Goals  going down stairs step over step    Currently in Pain?  No/denies    Pain Score  0-No pain    Multiple Pain Sites  No                       [facility] Adult PT Treatment/Exercise - 09/23/18 0901      Ambulation/Gait   Stairs  Yes    Stairs Assistance  5: Supervision    Stairs Assistance Details (indicate cue type and reason)  Working on reciprocal pattern descending stairs x 14 with 1 hand rail and cueing required to  avoid navigating sideways    Stair Management Technique  One rail Right;Alternating pattern;Step to pattern    Number of Stairs  7    Height of Stairs  14      Knee/Hip Exercises: Stretches   Passive Hamstring Stretch  Right;1 rep;30 seconds    Passive Hamstring Stretch Limitations  strap     ITB Stretch  Right;1 rep;30 seconds    ITB Stretch Limitations  strap       Knee/Hip Exercises: Aerobic   Nustep  Lvl 3, 6 min      Knee/Hip Exercises: Supine   Bridges with Ball Squeeze  10 reps    Straight Leg Raises  Right;10 reps      Knee/Hip Exercises: Sidelying   Clams  R clam shell with yellow looped TB at knees x 10 reps              PT Education - 09/23/18 0941    Education Details  HEP update    Person(s) Educated  Patient    Methods  Explanation;Demonstration;Verbal cues;Handout    Comprehension  Verbalized understanding;Returned demonstration;Verbal cues required;Need further instruction       PT Short Term Goals - 09/23/18 [id number]  PT SHORT TERM GOAL #1   Title  Patient to be independent with initial HEP.    Time  3    Period  Weeks    Status  Achieved        PT Long Term Goals - 09/23/18 4098      PT LONG TERM GOAL #1   Title  Patient to be independent with advanced HEP.     Time  6    Period  Weeks    Status  On-going      PT LONG TERM GOAL #2   Title  Patient to demonstrate >=4+/5 strength in B LEs without pain limiting.     Time  6    Period  Weeks    Status  On-going      PT LONG TERM GOAL #3   Title  Patient to demonstrate reciprocal stair climbing up/down 13 steps with 1 handrail with good stability and eccentric control.     Time  6    Period  Weeks    Status  On-going      PT LONG TERM GOAL #4   Title  Patient to report no episodes of R knee buckling in last 3 weeks.     Time  6    Period  Weeks    Status  On-going            Plan - 09/23/18 1191    Clinical Impression Statement  Pt. reporting only issue with HEP was seated  hamstring curl which has been difficult for her to set up at home.  Provided alternative with standing hamstring curl with yellow looped TB issued to pt. which she tolerated well.  HEP updated.   Pt. demonstrated limited LE stability descending stairs today however able to descend reciprocally with some apprehension.  Pt. tolerated all LE strengthening activities focused on R quad well today.  Ended visit pain free.  STG #1 achieved today.  Will monitor response to updated HEP and continue to progress toward goals in coming visits.      Clinical Impairments Affecting Rehab Potential  HTN, DM, TIA    PT Treatment/Interventions  ADLs/Self Care Home Management;Cryotherapy;Electrical Stimulation;Iontophoresis 4mg /ml Dexamethasone;Moist Heat;Ultrasound;DME Instruction;Gait training;Stair training;Functional mobility training;Therapeutic activities;Therapeutic exercise;Manual techniques;Orthotic Fit/Training;Patient/family education;Neuromuscular re-education;Balance training;Passive range of motion;Dry needling;Energy conservation;Splinting;Taping;Vasopneumatic Device    PT Next Visit Plan  Continue LE strengthening/stability training; step training    Consulted and Agree with Plan of Care  Patient       Patient will benefit from skilled therapeutic intervention in order to improve the following deficits and impairments:  Decreased strength, Pain, Decreased balance, Difficulty walking, Impaired flexibility, Postural dysfunction  Visit Diagnosis: Chronic pain of right knee  Muscle weakness (generalized)  Difficulty in walking, not elsewhere classified     Problem List Patient Active Problem List   Diagnosis Date Noted  . DM2 (diabetes mellitus, type 2) (HCC) 09/02/2018  . HTN (hypertension) 09/02/2018  . TIA (transient ischemic attack) 09/01/2018    Kermit Balo, PTA 09/23/18 9:50 AM   [facility] [address]  [address] [place], [place], [address] Phone: [phone]   Fax:  [phone]  Name: Samantha Wise MRN: [id number] Date of Birth: February 23, 1951

## 2018-09-30 ENCOUNTER — Ambulatory Visit: Payer: Medicare Other

## 2018-09-30 DIAGNOSIS — M25561 Pain in right knee: Principal | ICD-10-CM

## 2018-09-30 DIAGNOSIS — G8929 Other chronic pain: Secondary | ICD-10-CM

## 2018-09-30 DIAGNOSIS — M6281 Muscle weakness (generalized): Secondary | ICD-10-CM

## 2018-09-30 DIAGNOSIS — R262 Difficulty in walking, not elsewhere classified: Secondary | ICD-10-CM

## 2018-09-30 NOTE — Therapy (Signed)
[facility] [address]  [address] [place], [place], [address] Phone: [phone]   Fax:  [phone]  Physical Therapy Treatment  Patient Details  Name: Samantha Wise MRN: [id number] Date of Birth: 20-Mar-1951 Referring Provider (PT): Theodoro Kos, MD Zannie Cove, MD is ER doctor)   Encounter Date: 09/30/2018  PT End of Session - 09/30/18 0848    Visit Number  3    Number of Visits  7    Date for PT Re-Evaluation  10/28/18    Authorization Type  Medicare    PT Start Time  0845    PT Stop Time  0925    PT Time Calculation (min)  40 min    Activity Tolerance  Patient tolerated treatment well    Behavior During Therapy  [facility] for tasks assessed/performed       Past Medical History:  Diagnosis Date  . Diabetes mellitus without complication (HCC)   . Hypertension     Past Surgical History:  Procedure Laterality Date  . ABDOMINAL HYSTERECTOMY    . CHOLECYSTECTOMY      There were no vitals filed for this visit.  Subjective Assessment - 09/30/18 0847    Subjective  Pt. reporting she was able to drive ~ 6 hours with moving trailer without pain.      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 Small remote right cerebellar infarct.    Patient Stated Goals  going down stairs step over step    Currently in Pain?  No/denies    Pain Score  0-No pain    Multiple Pain Sites  No                       [facility] Adult PT Treatment/Exercise - 09/30/18 0859      Knee/Hip Exercises: Stretches   Passive Hamstring Stretch  Right;30 seconds;2 reps    Passive Hamstring Stretch Limitations  strap     ITB Stretch  Right;1 rep;30 seconds    ITB Stretch Limitations   strap     Piriformis Stretch  Right;1 rep;30 seconds    Piriformis Stretch Limitations  KTOS      Knee/Hip Exercises: Aerobic   Recumbent Bike  lvl 2, 7 min       Knee/Hip Exercises: Standing   Lateral Step Up  Right;10 reps;Hand Hold: 1;Step Height: 6"    Lateral Step Up Limitations  at TM rail    Forward Step Up  Right;10 reps;Hand Hold: 1;Step Height: 6"    Forward Step Up Limitations  at TM rail     Other Standing Knee Exercises  side stepping with red TB at ankles 2 x 20 ft       Knee/Hip Exercises: Seated   Other Seated Knee/Hip Exercises  R fitter leg press (1 blue band, 1 black band) x 10 reps       Knee/Hip Exercises: Supine   Bridges Limitations  --    Bridges with [employer]  --   x 12 reps; adduction ball squeeze    Straight Leg Raises  Right   x 12 reps             PT Education - 09/30/18 6962    Education Details  HEP update  Person(s) Educated  Patient    Methods  Explanation;Demonstration;Verbal cues;Handout    Comprehension  Verbalized understanding;Returned demonstration;Verbal cues required;Need further instruction       PT Short Term Goals - 09/23/18 0854      PT SHORT TERM GOAL #1   Title  Patient to be independent with initial HEP.    Time  3    Period  Weeks    Status  Achieved        PT Long Term Goals - 09/23/18 [id number]      PT LONG TERM GOAL #1   Title  Patient to be independent with advanced HEP.     Time  6    Period  Weeks    Status  On-going      PT LONG TERM GOAL #2   Title  Patient to demonstrate >=4+/5 strength in B LEs without pain limiting.     Time  6    Period  Weeks    Status  On-going      PT LONG TERM GOAL #3   Title  Patient to demonstrate reciprocal stair climbing up/down 13 steps with 1 handrail with good stability and eccentric control.     Time  6    Period  Weeks    Status  On-going      PT LONG TERM GOAL #4   Title  Patient to report no episodes of R knee buckling in last 3 weeks.     Time  6     Period  Weeks    Status  On-going            Plan - 09/30/18 0849    Clinical Impression Statement   Pt. reporting she was able to tolerate six hour car ride with moving trailer well without pain.  Tolerated progression of LE strengthening well today focused on quad strengthening and LE stretching targeted towards R proximal hip musculature which remains tight (ITB, piriformis, HS).  Ended visit pain free.  Will continue to progress toward goals.      Clinical Impairments Affecting Rehab Potential  HTN, DM, TIA    PT Treatment/Interventions  ADLs/Self Care Home Management;Cryotherapy;Electrical Stimulation;Iontophoresis 4mg /ml Dexamethasone;Moist Heat;Ultrasound;DME Instruction;Gait training;Stair training;Functional mobility training;Therapeutic activities;Therapeutic exercise;Manual techniques;Orthotic Fit/Training;Patient/family education;Neuromuscular re-education;Balance training;Passive range of motion;Dry needling;Energy conservation;Splinting;Taping;Vasopneumatic Device    PT Next Visit Plan  progress LE strengthening/stability training; step training    Consulted and Agree with Plan of Care  Patient       Patient will benefit from skilled therapeutic intervention in order to improve the following deficits and impairments:  Decreased strength, Pain, Decreased balance, Difficulty walking, Impaired flexibility, Postural dysfunction  Visit Diagnosis: Chronic pain of right knee  Muscle weakness (generalized)  Difficulty in walking, not elsewhere classified     Problem List Patient Active Problem List   Diagnosis Date Noted  . DM2 (diabetes mellitus, type 2) (HCC) 09/02/2018  . HTN (hypertension) 09/02/2018  . TIA (transient ischemic attack) 09/01/2018    Kermit BaloMicah Phoenyx Melka, PTA 09/30/18 9:28 AM    [facility] [address]  [address] [place], [place], [address] Phone: [phone]   Fax:  [phone]  Name: Samantha Rutherfordmaryllis  Wise MRN: [id number] Date of Birth: 15-Mar-1951

## 2018-10-06 ENCOUNTER — Ambulatory Visit: Payer: Medicare Other | Attending: Internal Medicine

## 2018-10-06 DIAGNOSIS — M25561 Pain in right knee: Secondary | ICD-10-CM | POA: Diagnosis not present

## 2018-10-06 DIAGNOSIS — G8929 Other chronic pain: Secondary | ICD-10-CM | POA: Diagnosis present

## 2018-10-06 DIAGNOSIS — M6281 Muscle weakness (generalized): Secondary | ICD-10-CM | POA: Diagnosis present

## 2018-10-06 DIAGNOSIS — R262 Difficulty in walking, not elsewhere classified: Secondary | ICD-10-CM

## 2018-10-06 NOTE — Therapy (Signed)
[facility] [address]  [address] [place], [place], [address] Phone: [phone]   Fax:  [phone]  Physical Therapy Treatment  Patient Details  Name: Samantha Wise MRN: [id number] Date of Birth: October 06, 1951 Referring Provider (PT): Theodoro Kos, MD Zannie Cove, MD is ER doctor)   Encounter Date: 10/06/2018  PT End of Session - 10/06/18 0944    Visit Number  4    Number of Visits  7    Date for PT Re-Evaluation  10/28/18    Authorization Type  Medicare    PT Start Time  0937    PT Stop Time  1018    PT Time Calculation (min)  41 min    Activity Tolerance  Patient tolerated treatment well    Behavior During Therapy  [facility] for tasks assessed/performed       Past Medical History:  Diagnosis Date  . Diabetes mellitus without complication (HCC)   . Hypertension     Past Surgical History:  Procedure Laterality Date  . ABDOMINAL HYSTERECTOMY    . CHOLECYSTECTOMY      There were no vitals filed for this visit.  Subjective Assessment - 10/06/18 0944    Subjective  Feels that stairs are still difficult.  Has been battling a cold over last few days and has not gotten her energy back yet.      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 Small remote right cerebellar infarct.    Patient Stated Goals  going down stairs step over step    Currently in Pain?  No/denies    Pain Score  0-No pain    Multiple Pain Sites  No                       [facility] Adult PT Treatment/Exercise - 10/06/18 0954      Knee/Hip Exercises: Aerobic   Tread Mill  Lvl 1.0, 5 min       Knee/Hip Exercises: Standing   Lateral Step Up  Right;10 reps;Hand Hold: 1;Step Height: 8"    Lateral Step Up Limitations  HH assistance from therapist     Forward Step Up  Right;10 reps;Hand Hold: 1;Step Height: 8"    Forward Step Up Limitations  HH assistance from therapist       Knee/Hip Exercises: Seated   Long Arc Quad  Right;15 reps;Strengthening    Long Arc Quad Limitations  yellow looped TB    Hamstring Curl  Right;15 reps    Hamstring Limitations  Red TB    Sit to Sand  15 reps;without UE support   with yellow TB at knees      Knee/Hip Exercises: Supine   Bridges with [employer]  --   x 12 reps    Straight Leg Raises  Right;15 reps;Strengthening      Knee/Hip Exercises: Sidelying   Clams  R clam shell with yellow looped TB at knees x 15 reps                PT Short Term Goals - 09/23/18 0854      PT SHORT TERM GOAL #1   Title  Patient to be independent with initial HEP.    Time  3    Period  Weeks  Status  Achieved        PT Long Term Goals - 09/23/18 0854      PT LONG TERM GOAL #1   Title  Patient to be independent with advanced HEP.     Time  6    Period  Weeks    Status  On-going      PT LONG TERM GOAL #2   Title  Patient to demonstrate >=4+/5 strength in B LEs without pain limiting.     Time  6    Period  Weeks    Status  On-going      PT LONG TERM GOAL #3   Title  Patient to demonstrate reciprocal stair climbing up/down 13 steps with 1 handrail with good stability and eccentric control.     Time  6    Period  Weeks    Status  On-going      PT LONG TERM GOAL #4   Title  Patient to report no episodes of R knee buckling in last 3 weeks.     Time  6    Period  Weeks    Status  On-going            Plan - 10/06/18 0953    Clinical Impression Statement  Pt. reporting she has low energy levels today due to battling a cold.  Does still report difficulty navigating stairs at son's house while she waits for her house to close.  Pt. tolerated all step training and proximal hip strengthening activities well today.  Able to demo  proper ITB stretch technique today with review and reports daily adherence to HEP.  Ended visit pain free and progressing well toward goals.      Clinical Impairments Affecting Rehab Potential  HTN, DM, TIA    PT Treatment/Interventions  ADLs/Self Care Home Management;Cryotherapy;Electrical Stimulation;Iontophoresis 4mg /ml Dexamethasone;Moist Heat;Ultrasound;DME Instruction;Gait training;Stair training;Functional mobility training;Therapeutic activities;Therapeutic exercise;Manual techniques;Orthotic Fit/Training;Patient/family education;Neuromuscular re-education;Balance training;Passive range of motion;Dry needling;Energy conservation;Splinting;Taping;Vasopneumatic Device    Consulted and Agree with Plan of Care  Patient       Patient will benefit from skilled therapeutic intervention in order to improve the following deficits and impairments:  Decreased strength, Pain, Decreased balance, Difficulty walking, Impaired flexibility, Postural dysfunction  Visit Diagnosis: Chronic pain of right knee  Muscle weakness (generalized)  Difficulty in walking, not elsewhere classified     Problem List Patient Active Problem List   Diagnosis Date Noted  . DM2 (diabetes mellitus, type 2) (HCC) 09/02/2018  . HTN (hypertension) 09/02/2018  . TIA (transient ischemic attack) 09/01/2018    Kermit BaloMicah Nydia Ytuarte, PTA 10/06/18 11:05 AM   [facility] [place] [address]  [address] [place], [place], [address] Phone: [phone]   Fax:  [phone]  Name: Narda Rutherfordmaryllis Sterbenz MRN: [id number] Date of Birth: 1950/12/17

## 2018-10-21 ENCOUNTER — Ambulatory Visit: Payer: Medicare Other | Admitting: Physical Therapy

## 2018-10-21 ENCOUNTER — Encounter: Payer: Self-pay | Admitting: Physical Therapy

## 2018-10-21 DIAGNOSIS — M25561 Pain in right knee: Secondary | ICD-10-CM | POA: Diagnosis not present

## 2018-10-21 DIAGNOSIS — M6281 Muscle weakness (generalized): Secondary | ICD-10-CM

## 2018-10-21 DIAGNOSIS — R262 Difficulty in walking, not elsewhere classified: Secondary | ICD-10-CM

## 2018-10-21 DIAGNOSIS — G8929 Other chronic pain: Secondary | ICD-10-CM

## 2018-10-21 NOTE — Therapy (Addendum)
[facility] [address]  [place], [place], [address] Phone: [phone]   Fax:  [phone]  Physical Therapy Treatment  Patient Details  Name: Samantha Wise MRN: [id number] Date of Birth: October 05, 1951 Referring Provider (PT): Galen Manila, MD Domenic Polite, MD is ER doctor)    Progress Note Reporting Period 09/16/18 to 10/21/18  See note below for Objective Data and Assessment of Progress/Goals.    Encounter Date: 10/21/2018  PT End of Session - 10/21/18 0934    Visit Number  5    Number of Visits  7    Date for PT Re-Evaluation  10/28/18    Authorization Type  Medicare    PT Start Time  0849    PT Stop Time  0929    PT Time Calculation (min)  40 min    Activity Tolerance  Patient tolerated treatment well    Behavior During Therapy  [facility] for tasks assessed/performed       Past Medical History:  Diagnosis Date  . Diabetes mellitus without complication ([place])   . Hypertension     Past Surgical History:  Procedure Laterality Date  . ABDOMINAL HYSTERECTOMY    . CHOLECYSTECTOMY      There were no vitals filed for this visit.  Subjective Assessment - 10/21/18 0850    Subjective  Patient reports that she has been doing well- notes no buckling in her knee. Says she has been stressed because her husband got in a MVA this week.     Pertinent History  HTN, DM    Diagnostic tests  09/03/18 MRA head: possible 64m anterior communicating artery complex aneurysm, Mild distal small vessel atheromatous irregularity, otherwise unremarkable; 09/03/18 MRI brain: No acute intracranial infarct or other abnormality identified. Small remote right cerebellar infarct.    Patient Stated Goals  going down stairs step over step    Currently in Pain?  No/denies                       [facility] - 10/21/18 0001      Knee/Hip Exercises: Stretches   Hip Flexor Stretch  Right;2  reps;30 seconds;Limitations    Hip Flexor Stretch Limitations  mod thomas with strap      Knee/Hip Exercises: Aerobic   Nustep  Lvl 3, 6 min      Knee/Hip Exercises: Standing   Knee Flexion  Strengthening;Right;1 set;15 reps    Knee Flexion Limitations  2#; at counter top    Terminal Knee Extension  Strengthening;Right;1 set;10 reps;Theraband    Theraband Level (Terminal Knee Extension)  Level 4 (Blue)    Terminal Knee Extension Limitations  at chair; 10x3"    Lateral Step Up  Right;10 reps;Step Height: 8";Hand Hold: 2    Lateral Step Up Limitations  cues for slow eccentric lower off step    Forward Step Up  Right;10 reps;Hand Hold: 1;Step Height: 8"    Forward Step Up Limitations  cues for slow eccentric lower off step      Knee/Hip Exercises: Seated   Long Arc Quad  Strengthening;Right;1 set;10 reps;Weights    Long Arc Quad Weight  3 lbs.    Long Arc Quad Limitations  slow eccentric lower    Hamstring Curl  Strengthening;Right;1 set;10 reps;Limitations    Hamstring Limitations  green TB      Knee/Hip Exercises: Supine   Bridges  Strengthening;Both;1 set;10 reps;Limitations    Bridges Limitations  B LEs straight on orange pball    Bridges with Clamshell  Strengthening;Both;1 set;15 reps;Limitations    Bridges with Clamshell  --   red band around knees            PT Education - 10/21/18 0934    Education Details  advised to use green TB for HS curl at home    Person(s) Educated  Patient    Methods  Explanation;Demonstration;Tactile cues;Verbal cues    Comprehension  Verbalized understanding;Returned demonstration       PT Short Term Goals - 09/23/18 0854      PT SHORT TERM GOAL #1   Title  Patient to be independent with initial HEP.    Time  3    Period  Weeks    Status  Achieved        PT Long Term Goals - 09/23/18 6834      PT LONG TERM GOAL #1   Title  Patient to be independent with advanced HEP.     Time  6    Period  Weeks    Status  On-going       PT LONG TERM GOAL #2   Title  Patient to demonstrate >=4+/5 strength in B LEs without pain limiting.     Time  6    Period  Weeks    Status  On-going      PT LONG TERM GOAL #3   Title  Patient to demonstrate reciprocal stair climbing up/down 13 steps with 1 handrail with good stability and eccentric control.     Time  6    Period  Weeks    Status  On-going      PT LONG TERM GOAL #4   Title  Patient to report no episodes of R knee buckling in last 3 weeks.     Time  6    Period  Weeks    Status  On-going            Plan - 10/21/18 0935    Clinical Impression Statement  Patient arrived to session with report that she has been busy with moving and taking care of her husband who was in a MVA. Worked on progressive LE strengthening this session with patient demonstrating good form. Able to progress hamstring curl with increased banded resistance. Introduced TKE with banded resistance with patient showing good effort and form- intermittent cues required for upright posture. Patient still exhibiting mild R quad instability with anterior and lateral step downs. Was able to complete entirety of today's session without pain. Advised patient to use green TB for hamstring curls at home. Patient reported understanding and with no complaints at end of session.     Clinical Impairments Affecting Rehab Potential  HTN, DM, TIA    PT Treatment/Interventions  ADLs/Self Care Home Management;Cryotherapy;Electrical Stimulation;Iontophoresis 76m/ml Dexamethasone;Moist Heat;Ultrasound;DME Instruction;Gait training;Stair training;Functional mobility training;Therapeutic activities;Therapeutic exercise;Manual techniques;Orthotic Fit/Training;Patient/family education;Neuromuscular re-education;Balance training;Passive range of motion;Dry needling;Energy conservation;Splinting;Taping;Vasopneumatic Device    PT Next Visit Plan  progress LE strengthening/stability training; step training    Consulted and Agree  with Plan of Care  Patient       Patient will benefit from skilled therapeutic intervention in order to improve the following deficits and impairments:  Decreased strength, Pain, Decreased balance, Difficulty walking, Impaired flexibility, Postural dysfunction  Visit Diagnosis: Chronic pain of right knee  Muscle weakness (generalized)  Difficulty in walking, not elsewhere classified     Problem List Patient Active Problem List  Diagnosis Date Noted  . DM2 (diabetes mellitus, type 2) ([place]) 09/02/2018  . HTN (hypertension) 09/02/2018  . TIA (transient ischemic attack) 09/01/2018    Janene Harvey, PT, DPT 10/21/18 9:37 AM   [facility] [address]  [place], [place], [address] Phone: [phone]   Fax:  [phone]  Name: Samantha Wise MRN: [id number] Date of Birth: [birth year]   PHYSICAL THERAPY DISCHARGE SUMMARY  Visits from Start of Care: 5  Current functional level related to goals / functional outcomes: Unable to assess; patient did not return after last session   Remaining deficits: Unable to assess   Education / Equipment: HEP  Plan: Patient agrees to discharge.  Patient goals were not met. Patient is being discharged due to not returning since the last visit.  ?????     Janene Harvey, PT, DPT 11/24/18 10:45 AM

## 2018-10-27 ENCOUNTER — Ambulatory Visit: Payer: Medicare Other | Admitting: Physical Therapy

## 2019-05-29 IMAGING — MR MR HEAD W/O CM
9 of 12 series · 27 of 48 positions shown · non-contrast
Comparison: Comparison made with prior CT from 09/01/2018

CLINICAL DATA: Initial evaluation for acute left-sided numbness.

EXAM:
MRI HEAD WITHOUT CONTRAST
MRA HEAD WITHOUT CONTRAST
TECHNIQUE: Multiplanar, multiecho pulse sequences of the brain and surrounding
structures were obtained without intravenous contrast. Angiographic
images of the head were obtained using MRA technique without
contrast.

[Series 3: DWI · axial · 3.0mm · 0.94mm/px · z∈[-104,+51]mm · 5 of 106 slices shown (1 of 2)]
[im 1/106]
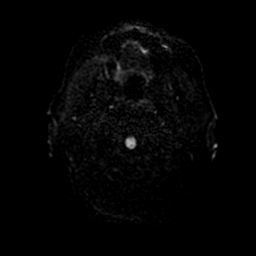
[im 27/106]
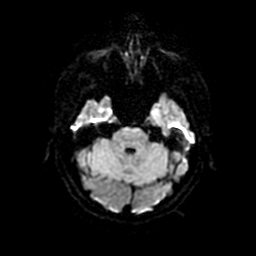
[im 53/106]
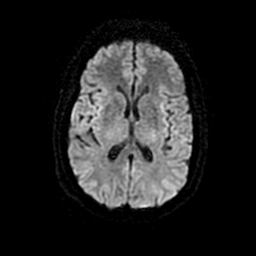
[im 79/106]
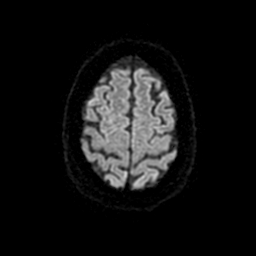
[im 106/106]
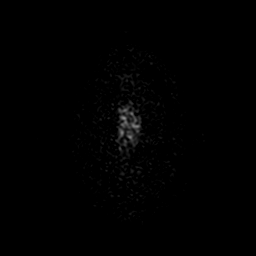

[Series 4: DWI · coronal · 4.0mm · 0.94mm/px · 4 of 72 slices shown (2 of 2)]
[im 1/72]
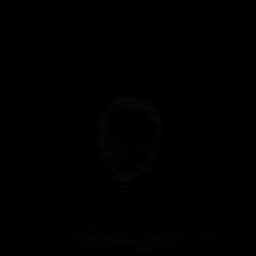
[im 24/72]
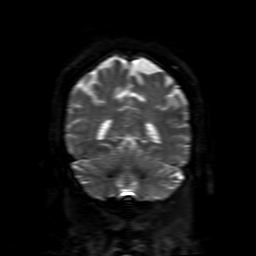
[im 48/72]
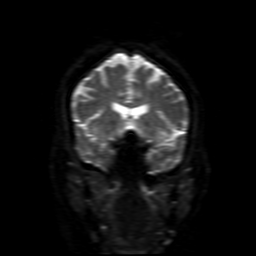
[im 72/72]
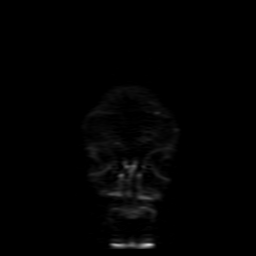

[Series 5: ax (id) 2 · axial · 1.0mm · 0.43mm/px · z∈[-93,-39]mm · 5 of 184 slices shown]
[im 1/184]
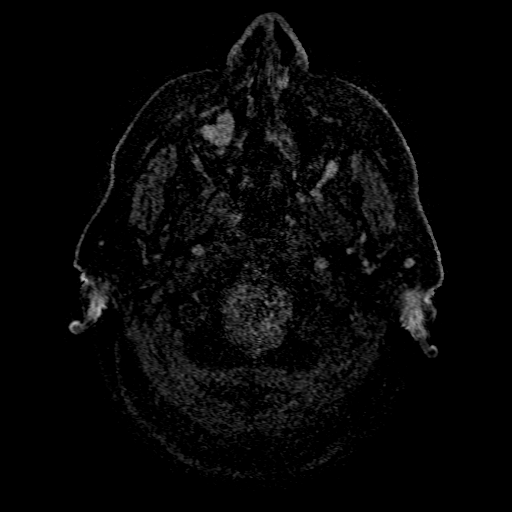
[im 37/184]
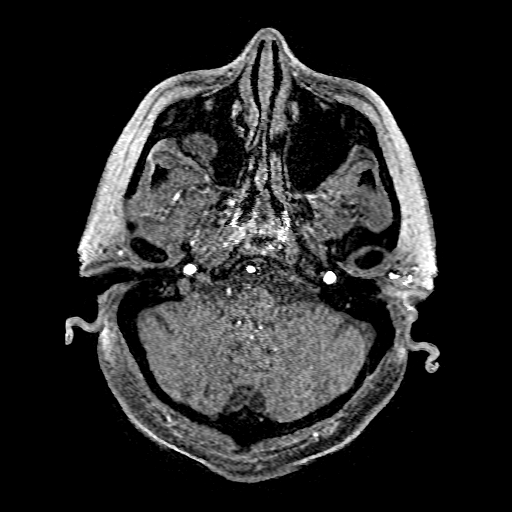
[im 55/184]
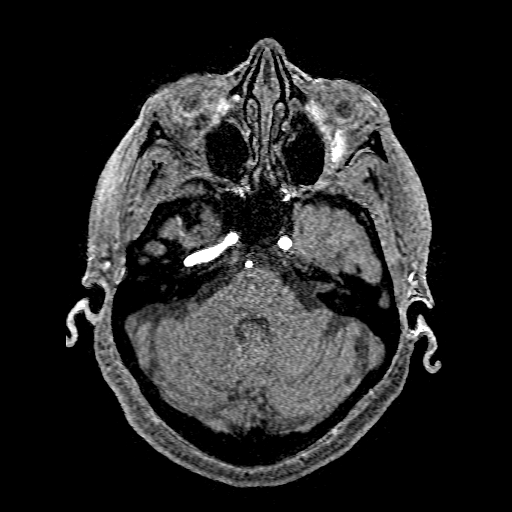
[im 74/184]
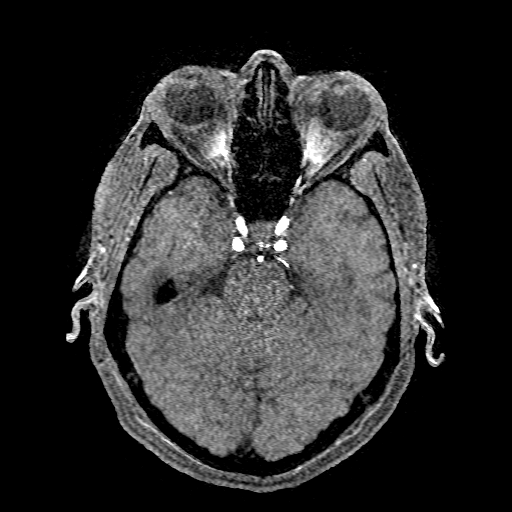
[im 110/184]
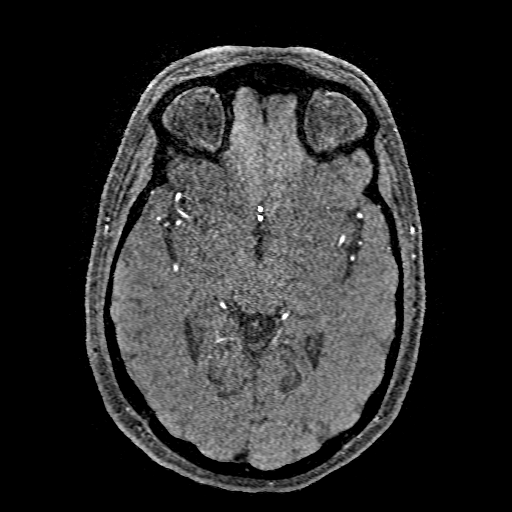

[Series 6: FLAIR · sagittal · 5.0mm · 0.47mm/px · 2 of 26 slices shown (1 of 2)]
[im 1/26]
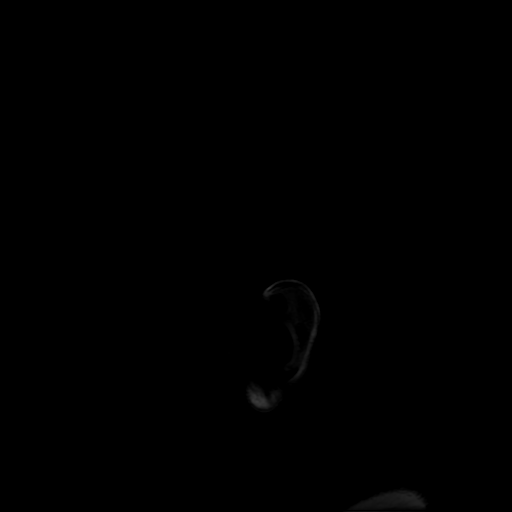
[im 26/26]
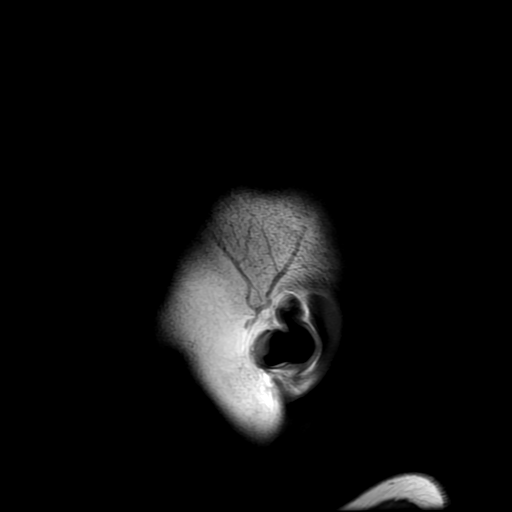

[Series 7: T2 · axial · 5.0mm · 0.47mm/px · z∈[-110,+46]mm · 2 of 27 slices shown (1 of 2)]
[im 1/27]
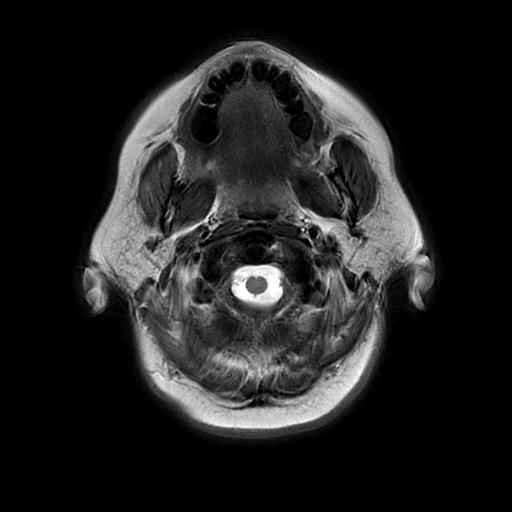
[im 27/27]
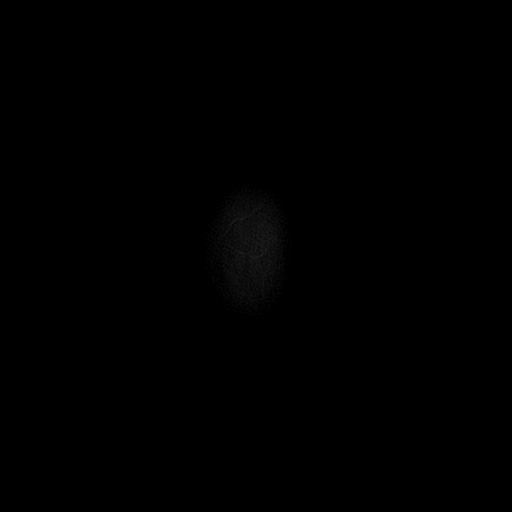

[Series 8: FLAIR · axial · 3.0mm · 0.45mm/px · z∈[-115,+41]mm · 2 of 27 slices shown (2 of 2)]
[im 1/27]
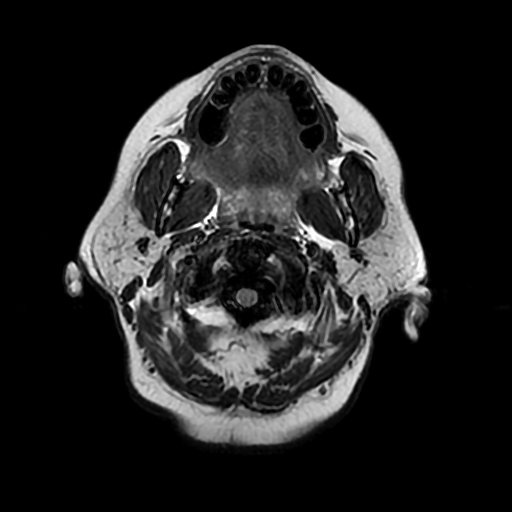
[im 27/27]
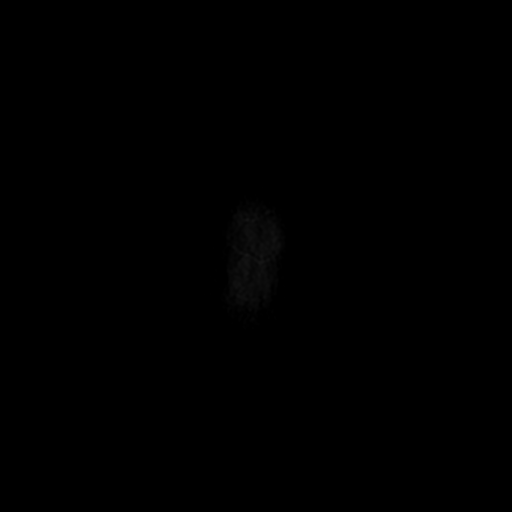

[Series 11: T2 · coronal · 5.0mm · 0.39mm/px · 2 of 31 slices shown (2 of 2)]
[im 1/31]
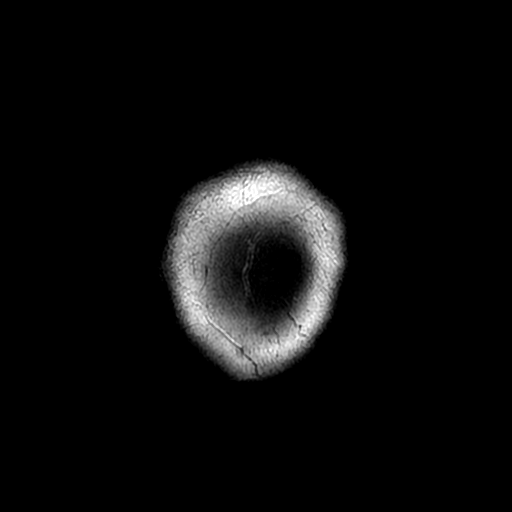
[im 31/31]
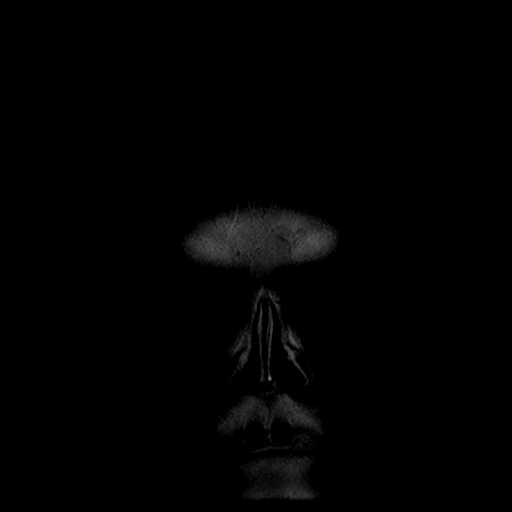

[Series 350: ADC · axial · 3.0mm · 0.94mm/px · z∈[-104,+51]mm · 3 of 53 slices shown (1 of 2)]
[im 1/53]
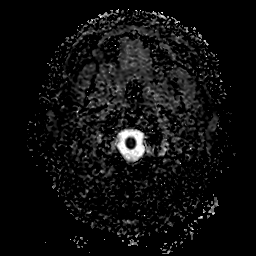
[im 27/53]
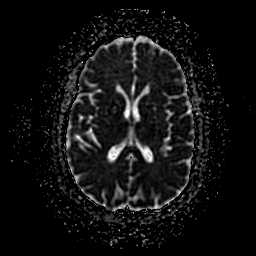
[im 53/53]
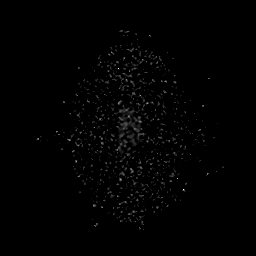

[Series 450: ADC · coronal · 4.0mm · 0.94mm/px · 2 of 36 slices shown (2 of 2)]
[im 1/36]
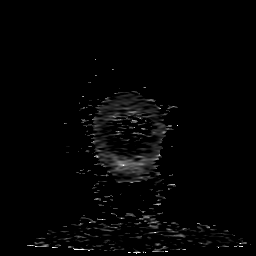
[im 36/36]
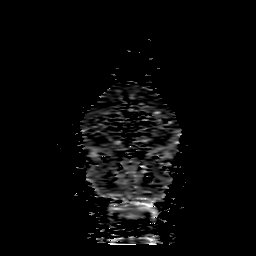

[27 of 48 positions shown; findings below may reference images not displayed]

FINDINGS: MRI HEAD FINDINGS

Brain: Generalized age-related cerebral atrophy. Mild chronic small
vessel ischemic changes present within the periventricular white
matter and pons. Small remote right cerebellar infarct noted.

No abnormal foci of restricted diffusion to suggest acute or
subacute ischemia. Gray-white matter differentiation maintained. No
other areas of remote cortical infarction. No foci of susceptibility
artifact to suggest acute or chronic intracranial hemorrhage.

No mass lesion, midline shift or mass effect. No hydrocephalus. No
extra-axial fluid collection. Pituitary gland within normal limits.

Vascular: Major intracranial vascular flow voids maintained.

Skull and upper cervical spine: Craniocervical junction within
normal limits. Visualized upper cervical spine unremarkable. No
focal marrow replacing lesion. Scalp soft tissues unremarkable.

Sinuses/Orbits: Globes and orbital soft tissues within normal
limits. Several small retention cyst noted within the right
maxillary sinus. Paranasal sinuses are otherwise clear. No
significant mastoid effusion. Inner ear structures grossly normal.

Other: None.

MRA HEAD FINDINGS

ANTERIOR CIRCULATION:

Distal cervical segments of the internal carotid arteries are widely
patent with antegrade flow. Petrous, cavernous, and supraclinoid
segments patent without hemodynamically significant stenosis. ICA
termini widely patent. A1 segments widely patent bilaterally. There
is question of a tiny 2 mm focal outpouching extending from the left
aspect of the anterior communicating artery complex (series 5, image
86), which could reflect a small aneurysm. Anterior cerebral
arteries widely patent distally without stenosis. M1 segments widely
patent bilaterally. Normal MCA bifurcations. No proximal M2
occlusion. Distal MCA branches well perfused and symmetric. Mild
distal small vessel atheromatous irregularity.

POSTERIOR CIRCULATION:

Visualized vertebral arteries patent to the vertebrobasilar junction
without stenosis. Left vertebral artery dominant. Posterior inferior
cerebral arteries patent proximally. Basilar widely patent to its
distal aspect without stenosis. Superior cerebral arteries patent
bilaterally. Right PCA supplied via the basilar. Predominant fetal
type origin of the left PCA supplied via a widely patent left
posterior communicating artery. PCAs widely patent to their distal
aspects. Mild distal small vessel atheromatous irregularity.
IMPRESSION: MRI HEAD IMPRESSION:

1. No acute intracranial infarct or other abnormality identified.
2. Small remote right cerebellar infarct.
3. Underlying mild chronic small vessel ischemic disease for age.

MRA HEAD IMPRESSION:

1. Question 2 mm focal outpouching arising from the left anterior
communicating artery complex, suspicious for a possible small
aneurysm.
2. Mild distal small vessel atheromatous irregularity throughout the
intracranial circulation.
3. Otherwise unremarkable intracranial MRA. No large vessel
occlusion. No hemodynamically significant or correctable stenosis.
